# Patient Record
Sex: Female | Born: 1945 | Race: White | Hispanic: No | Marital: Married | Smoking: Never smoker
Health system: Southern US, Community
[De-identification: ages and names within clinical notes are randomized; demographics above are authoritative.]

## PROBLEM LIST (undated history)

## (undated) DIAGNOSIS — H8390 Unspecified disease of inner ear, unspecified ear: Secondary | ICD-10-CM

## (undated) DIAGNOSIS — I10 Essential (primary) hypertension: Secondary | ICD-10-CM

---

## 1998-08-19 ENCOUNTER — Inpatient Hospital Stay (HOSPITAL_COMMUNITY): Admission: RE | Admit: 1998-08-19 | Discharge: 1998-08-21 | Payer: Self-pay | Admitting: Obstetrics & Gynecology

## 1999-09-07 ENCOUNTER — Other Ambulatory Visit: Admission: RE | Admit: 1999-09-07 | Discharge: 1999-09-07 | Payer: Self-pay | Admitting: Obstetrics & Gynecology

## 2001-11-29 ENCOUNTER — Other Ambulatory Visit: Admission: RE | Admit: 2001-11-29 | Discharge: 2001-11-29 | Payer: Self-pay | Admitting: Obstetrics & Gynecology

## 2003-01-30 ENCOUNTER — Other Ambulatory Visit: Admission: RE | Admit: 2003-01-30 | Discharge: 2003-01-30 | Payer: Self-pay | Admitting: Obstetrics & Gynecology

## 2004-04-15 ENCOUNTER — Other Ambulatory Visit: Admission: RE | Admit: 2004-04-15 | Discharge: 2004-04-15 | Payer: Self-pay | Admitting: Obstetrics & Gynecology

## 2005-04-05 ENCOUNTER — Inpatient Hospital Stay (HOSPITAL_COMMUNITY): Admission: RE | Admit: 2005-04-05 | Discharge: 2005-04-09 | Payer: Self-pay | Admitting: Orthopedic Surgery

## 2005-07-26 ENCOUNTER — Encounter: Admission: RE | Admit: 2005-07-26 | Discharge: 2005-07-26 | Payer: Self-pay | Admitting: Internal Medicine

## 2005-12-15 ENCOUNTER — Other Ambulatory Visit: Admission: RE | Admit: 2005-12-15 | Discharge: 2005-12-15 | Payer: Self-pay | Admitting: Obstetrics & Gynecology

## 2006-04-04 ENCOUNTER — Inpatient Hospital Stay (HOSPITAL_COMMUNITY): Admission: RE | Admit: 2006-04-04 | Discharge: 2006-04-07 | Payer: Self-pay | Admitting: Orthopedic Surgery

## 2011-11-15 DIAGNOSIS — E785 Hyperlipidemia, unspecified: Secondary | ICD-10-CM | POA: Diagnosis not present

## 2011-11-15 DIAGNOSIS — I10 Essential (primary) hypertension: Secondary | ICD-10-CM | POA: Diagnosis not present

## 2011-11-15 DIAGNOSIS — R82998 Other abnormal findings in urine: Secondary | ICD-10-CM | POA: Diagnosis not present

## 2011-11-22 DIAGNOSIS — E785 Hyperlipidemia, unspecified: Secondary | ICD-10-CM | POA: Diagnosis not present

## 2011-11-22 DIAGNOSIS — Z0000 Encounter for general adult medical examination without abnormal findings: Secondary | ICD-10-CM | POA: Diagnosis not present

## 2011-11-22 DIAGNOSIS — I10 Essential (primary) hypertension: Secondary | ICD-10-CM | POA: Diagnosis not present

## 2011-11-23 DIAGNOSIS — Z1212 Encounter for screening for malignant neoplasm of rectum: Secondary | ICD-10-CM | POA: Diagnosis not present

## 2011-12-20 DIAGNOSIS — K648 Other hemorrhoids: Secondary | ICD-10-CM | POA: Diagnosis not present

## 2011-12-20 DIAGNOSIS — Z1211 Encounter for screening for malignant neoplasm of colon: Secondary | ICD-10-CM | POA: Diagnosis not present

## 2011-12-20 DIAGNOSIS — Z86010 Personal history of colonic polyps: Secondary | ICD-10-CM | POA: Diagnosis not present

## 2011-12-20 DIAGNOSIS — K5730 Diverticulosis of large intestine without perforation or abscess without bleeding: Secondary | ICD-10-CM | POA: Diagnosis not present

## 2012-05-31 DIAGNOSIS — H8109 Meniere's disease, unspecified ear: Secondary | ICD-10-CM | POA: Diagnosis not present

## 2012-05-31 DIAGNOSIS — M1990 Unspecified osteoarthritis, unspecified site: Secondary | ICD-10-CM | POA: Diagnosis not present

## 2012-05-31 DIAGNOSIS — I10 Essential (primary) hypertension: Secondary | ICD-10-CM | POA: Diagnosis not present

## 2012-08-08 DIAGNOSIS — Z23 Encounter for immunization: Secondary | ICD-10-CM | POA: Diagnosis not present

## 2013-01-01 DIAGNOSIS — I10 Essential (primary) hypertension: Secondary | ICD-10-CM | POA: Diagnosis not present

## 2013-01-01 DIAGNOSIS — E785 Hyperlipidemia, unspecified: Secondary | ICD-10-CM | POA: Diagnosis not present

## 2013-01-01 DIAGNOSIS — R82998 Other abnormal findings in urine: Secondary | ICD-10-CM | POA: Diagnosis not present

## 2013-01-08 DIAGNOSIS — E785 Hyperlipidemia, unspecified: Secondary | ICD-10-CM | POA: Diagnosis not present

## 2013-01-08 DIAGNOSIS — F411 Generalized anxiety disorder: Secondary | ICD-10-CM | POA: Diagnosis not present

## 2013-01-08 DIAGNOSIS — Z79899 Other long term (current) drug therapy: Secondary | ICD-10-CM | POA: Diagnosis not present

## 2013-01-08 DIAGNOSIS — I10 Essential (primary) hypertension: Secondary | ICD-10-CM | POA: Diagnosis not present

## 2013-01-08 DIAGNOSIS — M545 Low back pain: Secondary | ICD-10-CM | POA: Diagnosis not present

## 2013-01-08 DIAGNOSIS — E669 Obesity, unspecified: Secondary | ICD-10-CM | POA: Diagnosis not present

## 2013-01-08 DIAGNOSIS — Z0000 Encounter for general adult medical examination without abnormal findings: Secondary | ICD-10-CM | POA: Diagnosis not present

## 2013-01-10 DIAGNOSIS — Z1212 Encounter for screening for malignant neoplasm of rectum: Secondary | ICD-10-CM | POA: Diagnosis not present

## 2013-10-22 DIAGNOSIS — Z23 Encounter for immunization: Secondary | ICD-10-CM | POA: Diagnosis not present

## 2013-12-10 DIAGNOSIS — H3581 Retinal edema: Secondary | ICD-10-CM | POA: Diagnosis not present

## 2013-12-24 DIAGNOSIS — H3581 Retinal edema: Secondary | ICD-10-CM | POA: Diagnosis not present

## 2014-01-07 ENCOUNTER — Encounter (INDEPENDENT_AMBULATORY_CARE_PROVIDER_SITE_OTHER): Payer: Medicare Other | Admitting: Ophthalmology

## 2014-01-07 DIAGNOSIS — I10 Essential (primary) hypertension: Secondary | ICD-10-CM

## 2014-01-07 DIAGNOSIS — H35719 Central serous chorioretinopathy, unspecified eye: Secondary | ICD-10-CM | POA: Diagnosis not present

## 2014-01-07 DIAGNOSIS — H43819 Vitreous degeneration, unspecified eye: Secondary | ICD-10-CM

## 2014-01-07 DIAGNOSIS — H35039 Hypertensive retinopathy, unspecified eye: Secondary | ICD-10-CM | POA: Diagnosis not present

## 2014-01-21 DIAGNOSIS — R82998 Other abnormal findings in urine: Secondary | ICD-10-CM | POA: Diagnosis not present

## 2014-01-21 DIAGNOSIS — I10 Essential (primary) hypertension: Secondary | ICD-10-CM | POA: Diagnosis not present

## 2014-01-21 DIAGNOSIS — E785 Hyperlipidemia, unspecified: Secondary | ICD-10-CM | POA: Diagnosis not present

## 2014-01-28 DIAGNOSIS — Z1331 Encounter for screening for depression: Secondary | ICD-10-CM | POA: Diagnosis not present

## 2014-01-28 DIAGNOSIS — M545 Low back pain, unspecified: Secondary | ICD-10-CM | POA: Diagnosis not present

## 2014-01-28 DIAGNOSIS — R7309 Other abnormal glucose: Secondary | ICD-10-CM | POA: Diagnosis not present

## 2014-01-28 DIAGNOSIS — I10 Essential (primary) hypertension: Secondary | ICD-10-CM | POA: Diagnosis not present

## 2014-01-28 DIAGNOSIS — E785 Hyperlipidemia, unspecified: Secondary | ICD-10-CM | POA: Diagnosis not present

## 2014-01-28 DIAGNOSIS — F411 Generalized anxiety disorder: Secondary | ICD-10-CM | POA: Diagnosis not present

## 2014-01-28 DIAGNOSIS — M1990 Unspecified osteoarthritis, unspecified site: Secondary | ICD-10-CM | POA: Diagnosis not present

## 2014-01-28 DIAGNOSIS — E669 Obesity, unspecified: Secondary | ICD-10-CM | POA: Diagnosis not present

## 2014-01-28 DIAGNOSIS — Z0000 Encounter for general adult medical examination without abnormal findings: Secondary | ICD-10-CM | POA: Diagnosis not present

## 2014-01-30 DIAGNOSIS — Z1212 Encounter for screening for malignant neoplasm of rectum: Secondary | ICD-10-CM | POA: Diagnosis not present

## 2014-02-04 ENCOUNTER — Encounter (INDEPENDENT_AMBULATORY_CARE_PROVIDER_SITE_OTHER): Payer: Medicare Other | Admitting: Ophthalmology

## 2014-02-04 DIAGNOSIS — H2510 Age-related nuclear cataract, unspecified eye: Secondary | ICD-10-CM

## 2014-02-04 DIAGNOSIS — H35719 Central serous chorioretinopathy, unspecified eye: Secondary | ICD-10-CM

## 2014-02-04 DIAGNOSIS — I10 Essential (primary) hypertension: Secondary | ICD-10-CM | POA: Diagnosis not present

## 2014-02-04 DIAGNOSIS — H35039 Hypertensive retinopathy, unspecified eye: Secondary | ICD-10-CM

## 2014-02-04 DIAGNOSIS — H43819 Vitreous degeneration, unspecified eye: Secondary | ICD-10-CM

## 2014-03-04 ENCOUNTER — Encounter (INDEPENDENT_AMBULATORY_CARE_PROVIDER_SITE_OTHER): Payer: Medicare Other | Admitting: Ophthalmology

## 2014-03-04 DIAGNOSIS — H35039 Hypertensive retinopathy, unspecified eye: Secondary | ICD-10-CM

## 2014-03-04 DIAGNOSIS — H43819 Vitreous degeneration, unspecified eye: Secondary | ICD-10-CM

## 2014-03-04 DIAGNOSIS — I10 Essential (primary) hypertension: Secondary | ICD-10-CM | POA: Diagnosis not present

## 2014-03-04 DIAGNOSIS — H2510 Age-related nuclear cataract, unspecified eye: Secondary | ICD-10-CM

## 2014-03-04 DIAGNOSIS — H35719 Central serous chorioretinopathy, unspecified eye: Secondary | ICD-10-CM

## 2014-03-06 ENCOUNTER — Encounter (INDEPENDENT_AMBULATORY_CARE_PROVIDER_SITE_OTHER): Payer: Medicare Other | Admitting: Ophthalmology

## 2014-03-28 DIAGNOSIS — Z1231 Encounter for screening mammogram for malignant neoplasm of breast: Secondary | ICD-10-CM | POA: Diagnosis not present

## 2014-03-28 DIAGNOSIS — Z124 Encounter for screening for malignant neoplasm of cervix: Secondary | ICD-10-CM | POA: Diagnosis not present

## 2014-04-08 ENCOUNTER — Encounter (INDEPENDENT_AMBULATORY_CARE_PROVIDER_SITE_OTHER): Payer: Medicare Other | Admitting: Ophthalmology

## 2014-04-08 DIAGNOSIS — H35039 Hypertensive retinopathy, unspecified eye: Secondary | ICD-10-CM

## 2014-04-08 DIAGNOSIS — H43819 Vitreous degeneration, unspecified eye: Secondary | ICD-10-CM

## 2014-04-08 DIAGNOSIS — H35719 Central serous chorioretinopathy, unspecified eye: Secondary | ICD-10-CM | POA: Diagnosis not present

## 2014-04-08 DIAGNOSIS — I10 Essential (primary) hypertension: Secondary | ICD-10-CM | POA: Diagnosis not present

## 2014-04-08 DIAGNOSIS — H2510 Age-related nuclear cataract, unspecified eye: Secondary | ICD-10-CM

## 2014-06-10 ENCOUNTER — Encounter (INDEPENDENT_AMBULATORY_CARE_PROVIDER_SITE_OTHER): Payer: Medicare Other | Admitting: Ophthalmology

## 2014-06-10 DIAGNOSIS — H35719 Central serous chorioretinopathy, unspecified eye: Secondary | ICD-10-CM

## 2014-06-10 DIAGNOSIS — I10 Essential (primary) hypertension: Secondary | ICD-10-CM | POA: Diagnosis not present

## 2014-06-10 DIAGNOSIS — H43819 Vitreous degeneration, unspecified eye: Secondary | ICD-10-CM | POA: Diagnosis not present

## 2014-06-10 DIAGNOSIS — H35039 Hypertensive retinopathy, unspecified eye: Secondary | ICD-10-CM

## 2014-06-10 DIAGNOSIS — H2510 Age-related nuclear cataract, unspecified eye: Secondary | ICD-10-CM

## 2014-07-29 ENCOUNTER — Encounter (INDEPENDENT_AMBULATORY_CARE_PROVIDER_SITE_OTHER): Payer: Medicare Other | Admitting: Ophthalmology

## 2014-07-29 DIAGNOSIS — H43813 Vitreous degeneration, bilateral: Secondary | ICD-10-CM

## 2014-07-29 DIAGNOSIS — H35033 Hypertensive retinopathy, bilateral: Secondary | ICD-10-CM | POA: Diagnosis not present

## 2014-07-29 DIAGNOSIS — H35721 Serous detachment of retinal pigment epithelium, right eye: Secondary | ICD-10-CM | POA: Diagnosis not present

## 2014-07-29 DIAGNOSIS — I10 Essential (primary) hypertension: Secondary | ICD-10-CM

## 2014-08-19 ENCOUNTER — Encounter (INDEPENDENT_AMBULATORY_CARE_PROVIDER_SITE_OTHER): Payer: Medicare Other | Admitting: Ophthalmology

## 2014-08-19 DIAGNOSIS — H43813 Vitreous degeneration, bilateral: Secondary | ICD-10-CM | POA: Diagnosis not present

## 2014-08-19 DIAGNOSIS — H35033 Hypertensive retinopathy, bilateral: Secondary | ICD-10-CM | POA: Diagnosis not present

## 2014-08-19 DIAGNOSIS — H35712 Central serous chorioretinopathy, left eye: Secondary | ICD-10-CM

## 2014-08-19 DIAGNOSIS — I10 Essential (primary) hypertension: Secondary | ICD-10-CM

## 2014-08-19 DIAGNOSIS — H2513 Age-related nuclear cataract, bilateral: Secondary | ICD-10-CM | POA: Diagnosis not present

## 2014-09-03 DIAGNOSIS — J301 Allergic rhinitis due to pollen: Secondary | ICD-10-CM | POA: Diagnosis not present

## 2014-09-03 DIAGNOSIS — J320 Chronic maxillary sinusitis: Secondary | ICD-10-CM | POA: Diagnosis not present

## 2014-09-03 DIAGNOSIS — H6522 Chronic serous otitis media, left ear: Secondary | ICD-10-CM | POA: Diagnosis not present

## 2014-09-03 DIAGNOSIS — H8103 Meniere's disease, bilateral: Secondary | ICD-10-CM | POA: Diagnosis not present

## 2014-09-03 DIAGNOSIS — J040 Acute laryngitis: Secondary | ICD-10-CM | POA: Diagnosis not present

## 2014-09-03 DIAGNOSIS — J322 Chronic ethmoidal sinusitis: Secondary | ICD-10-CM | POA: Diagnosis not present

## 2014-09-03 DIAGNOSIS — J330 Polyp of nasal cavity: Secondary | ICD-10-CM | POA: Diagnosis not present

## 2014-09-03 DIAGNOSIS — J3081 Allergic rhinitis due to animal (cat) (dog) hair and dander: Secondary | ICD-10-CM | POA: Diagnosis not present

## 2014-09-10 DIAGNOSIS — J320 Chronic maxillary sinusitis: Secondary | ICD-10-CM | POA: Diagnosis not present

## 2014-09-10 DIAGNOSIS — J322 Chronic ethmoidal sinusitis: Secondary | ICD-10-CM | POA: Diagnosis not present

## 2014-09-10 DIAGNOSIS — H8103 Meniere's disease, bilateral: Secondary | ICD-10-CM | POA: Diagnosis not present

## 2014-09-30 ENCOUNTER — Encounter (INDEPENDENT_AMBULATORY_CARE_PROVIDER_SITE_OTHER): Payer: Medicare Other | Admitting: Ophthalmology

## 2014-09-30 DIAGNOSIS — I10 Essential (primary) hypertension: Secondary | ICD-10-CM

## 2014-09-30 DIAGNOSIS — H35033 Hypertensive retinopathy, bilateral: Secondary | ICD-10-CM | POA: Diagnosis not present

## 2014-09-30 DIAGNOSIS — H43813 Vitreous degeneration, bilateral: Secondary | ICD-10-CM

## 2014-09-30 DIAGNOSIS — H2513 Age-related nuclear cataract, bilateral: Secondary | ICD-10-CM | POA: Diagnosis not present

## 2014-09-30 DIAGNOSIS — H35722 Serous detachment of retinal pigment epithelium, left eye: Secondary | ICD-10-CM | POA: Diagnosis not present

## 2014-11-07 DIAGNOSIS — Z23 Encounter for immunization: Secondary | ICD-10-CM | POA: Diagnosis not present

## 2014-11-25 ENCOUNTER — Encounter (INDEPENDENT_AMBULATORY_CARE_PROVIDER_SITE_OTHER): Payer: Medicare Other | Admitting: Ophthalmology

## 2014-11-25 DIAGNOSIS — H2513 Age-related nuclear cataract, bilateral: Secondary | ICD-10-CM | POA: Diagnosis not present

## 2014-11-25 DIAGNOSIS — I10 Essential (primary) hypertension: Secondary | ICD-10-CM

## 2014-11-25 DIAGNOSIS — H35722 Serous detachment of retinal pigment epithelium, left eye: Secondary | ICD-10-CM | POA: Diagnosis not present

## 2014-11-25 DIAGNOSIS — H35033 Hypertensive retinopathy, bilateral: Secondary | ICD-10-CM | POA: Diagnosis not present

## 2014-11-25 DIAGNOSIS — H43813 Vitreous degeneration, bilateral: Secondary | ICD-10-CM

## 2014-12-16 ENCOUNTER — Ambulatory Visit (INDEPENDENT_AMBULATORY_CARE_PROVIDER_SITE_OTHER): Payer: Medicare Other | Admitting: Ophthalmology

## 2015-01-29 DIAGNOSIS — I10 Essential (primary) hypertension: Secondary | ICD-10-CM | POA: Diagnosis not present

## 2015-01-29 DIAGNOSIS — R8299 Other abnormal findings in urine: Secondary | ICD-10-CM | POA: Diagnosis not present

## 2015-01-29 DIAGNOSIS — E785 Hyperlipidemia, unspecified: Secondary | ICD-10-CM | POA: Diagnosis not present

## 2015-01-29 DIAGNOSIS — R7301 Impaired fasting glucose: Secondary | ICD-10-CM | POA: Diagnosis not present

## 2015-01-29 DIAGNOSIS — N390 Urinary tract infection, site not specified: Secondary | ICD-10-CM | POA: Diagnosis not present

## 2015-02-05 DIAGNOSIS — Z1389 Encounter for screening for other disorder: Secondary | ICD-10-CM | POA: Diagnosis not present

## 2015-02-05 DIAGNOSIS — J302 Other seasonal allergic rhinitis: Secondary | ICD-10-CM | POA: Diagnosis not present

## 2015-02-05 DIAGNOSIS — N390 Urinary tract infection, site not specified: Secondary | ICD-10-CM | POA: Diagnosis not present

## 2015-02-05 DIAGNOSIS — Z0000 Encounter for general adult medical examination without abnormal findings: Secondary | ICD-10-CM | POA: Diagnosis not present

## 2015-02-05 DIAGNOSIS — R7301 Impaired fasting glucose: Secondary | ICD-10-CM | POA: Diagnosis not present

## 2015-02-05 DIAGNOSIS — I10 Essential (primary) hypertension: Secondary | ICD-10-CM | POA: Diagnosis not present

## 2015-02-05 DIAGNOSIS — E669 Obesity, unspecified: Secondary | ICD-10-CM | POA: Diagnosis not present

## 2015-02-05 DIAGNOSIS — E785 Hyperlipidemia, unspecified: Secondary | ICD-10-CM | POA: Diagnosis not present

## 2015-02-05 DIAGNOSIS — Z6841 Body Mass Index (BMI) 40.0 and over, adult: Secondary | ICD-10-CM | POA: Diagnosis not present

## 2015-02-05 DIAGNOSIS — M545 Low back pain: Secondary | ICD-10-CM | POA: Diagnosis not present

## 2015-02-05 DIAGNOSIS — Z23 Encounter for immunization: Secondary | ICD-10-CM | POA: Diagnosis not present

## 2015-02-24 ENCOUNTER — Encounter (INDEPENDENT_AMBULATORY_CARE_PROVIDER_SITE_OTHER): Payer: Medicare Other | Admitting: Ophthalmology

## 2015-02-24 DIAGNOSIS — I10 Essential (primary) hypertension: Secondary | ICD-10-CM | POA: Diagnosis not present

## 2015-02-24 DIAGNOSIS — H35712 Central serous chorioretinopathy, left eye: Secondary | ICD-10-CM | POA: Diagnosis not present

## 2015-02-24 DIAGNOSIS — H2513 Age-related nuclear cataract, bilateral: Secondary | ICD-10-CM | POA: Diagnosis not present

## 2015-02-24 DIAGNOSIS — H35033 Hypertensive retinopathy, bilateral: Secondary | ICD-10-CM

## 2015-02-24 DIAGNOSIS — H43813 Vitreous degeneration, bilateral: Secondary | ICD-10-CM

## 2015-03-03 DIAGNOSIS — H35712 Central serous chorioretinopathy, left eye: Secondary | ICD-10-CM | POA: Diagnosis not present

## 2015-03-12 DIAGNOSIS — Z780 Asymptomatic menopausal state: Secondary | ICD-10-CM | POA: Diagnosis not present

## 2015-06-13 DIAGNOSIS — J040 Acute laryngitis: Secondary | ICD-10-CM | POA: Diagnosis not present

## 2015-06-13 DIAGNOSIS — H903 Sensorineural hearing loss, bilateral: Secondary | ICD-10-CM | POA: Diagnosis not present

## 2015-06-13 DIAGNOSIS — H8103 Meniere's disease, bilateral: Secondary | ICD-10-CM | POA: Diagnosis not present

## 2015-06-13 DIAGNOSIS — J410 Simple chronic bronchitis: Secondary | ICD-10-CM | POA: Diagnosis not present

## 2015-06-13 DIAGNOSIS — J320 Chronic maxillary sinusitis: Secondary | ICD-10-CM | POA: Diagnosis not present

## 2015-06-13 DIAGNOSIS — J322 Chronic ethmoidal sinusitis: Secondary | ICD-10-CM | POA: Diagnosis not present

## 2015-06-26 DIAGNOSIS — J302 Other seasonal allergic rhinitis: Secondary | ICD-10-CM | POA: Diagnosis not present

## 2015-06-26 DIAGNOSIS — J301 Allergic rhinitis due to pollen: Secondary | ICD-10-CM | POA: Diagnosis not present

## 2015-07-24 DIAGNOSIS — J320 Chronic maxillary sinusitis: Secondary | ICD-10-CM | POA: Diagnosis not present

## 2015-07-24 DIAGNOSIS — H9313 Tinnitus, bilateral: Secondary | ICD-10-CM | POA: Diagnosis not present

## 2015-07-24 DIAGNOSIS — H8103 Meniere's disease, bilateral: Secondary | ICD-10-CM | POA: Diagnosis not present

## 2015-07-24 DIAGNOSIS — J322 Chronic ethmoidal sinusitis: Secondary | ICD-10-CM | POA: Diagnosis not present

## 2015-07-24 DIAGNOSIS — H8143 Vertigo of central origin, bilateral: Secondary | ICD-10-CM | POA: Diagnosis not present

## 2015-07-28 ENCOUNTER — Encounter (INDEPENDENT_AMBULATORY_CARE_PROVIDER_SITE_OTHER): Payer: Medicare Other | Admitting: Ophthalmology

## 2015-07-28 DIAGNOSIS — H35033 Hypertensive retinopathy, bilateral: Secondary | ICD-10-CM

## 2015-07-28 DIAGNOSIS — H2513 Age-related nuclear cataract, bilateral: Secondary | ICD-10-CM | POA: Diagnosis not present

## 2015-07-28 DIAGNOSIS — I10 Essential (primary) hypertension: Secondary | ICD-10-CM | POA: Diagnosis not present

## 2015-07-28 DIAGNOSIS — H43813 Vitreous degeneration, bilateral: Secondary | ICD-10-CM

## 2015-07-28 DIAGNOSIS — H35712 Central serous chorioretinopathy, left eye: Secondary | ICD-10-CM

## 2015-10-22 DIAGNOSIS — Z23 Encounter for immunization: Secondary | ICD-10-CM | POA: Diagnosis not present

## 2015-12-29 ENCOUNTER — Encounter (INDEPENDENT_AMBULATORY_CARE_PROVIDER_SITE_OTHER): Payer: Medicare Other | Admitting: Ophthalmology

## 2015-12-29 DIAGNOSIS — H43813 Vitreous degeneration, bilateral: Secondary | ICD-10-CM

## 2015-12-29 DIAGNOSIS — H35033 Hypertensive retinopathy, bilateral: Secondary | ICD-10-CM | POA: Diagnosis not present

## 2015-12-29 DIAGNOSIS — H35712 Central serous chorioretinopathy, left eye: Secondary | ICD-10-CM

## 2015-12-29 DIAGNOSIS — I10 Essential (primary) hypertension: Secondary | ICD-10-CM

## 2015-12-29 DIAGNOSIS — H2513 Age-related nuclear cataract, bilateral: Secondary | ICD-10-CM | POA: Diagnosis not present

## 2016-01-27 DIAGNOSIS — H8143 Vertigo of central origin, bilateral: Secondary | ICD-10-CM | POA: Diagnosis not present

## 2016-01-27 DIAGNOSIS — H9313 Tinnitus, bilateral: Secondary | ICD-10-CM | POA: Diagnosis not present

## 2016-01-27 DIAGNOSIS — J320 Chronic maxillary sinusitis: Secondary | ICD-10-CM | POA: Diagnosis not present

## 2016-01-27 DIAGNOSIS — H8103 Meniere's disease, bilateral: Secondary | ICD-10-CM | POA: Diagnosis not present

## 2016-01-27 DIAGNOSIS — J322 Chronic ethmoidal sinusitis: Secondary | ICD-10-CM | POA: Diagnosis not present

## 2016-01-27 DIAGNOSIS — B370 Candidal stomatitis: Secondary | ICD-10-CM | POA: Diagnosis not present

## 2016-02-03 DIAGNOSIS — Z96651 Presence of right artificial knee joint: Secondary | ICD-10-CM | POA: Diagnosis not present

## 2016-02-03 DIAGNOSIS — Z471 Aftercare following joint replacement surgery: Secondary | ICD-10-CM | POA: Diagnosis not present

## 2016-02-03 DIAGNOSIS — Z96653 Presence of artificial knee joint, bilateral: Secondary | ICD-10-CM | POA: Diagnosis not present

## 2016-02-03 DIAGNOSIS — Z96652 Presence of left artificial knee joint: Secondary | ICD-10-CM | POA: Diagnosis not present

## 2016-02-04 DIAGNOSIS — R7301 Impaired fasting glucose: Secondary | ICD-10-CM | POA: Diagnosis not present

## 2016-02-04 DIAGNOSIS — E784 Other hyperlipidemia: Secondary | ICD-10-CM | POA: Diagnosis not present

## 2016-02-04 DIAGNOSIS — I10 Essential (primary) hypertension: Secondary | ICD-10-CM | POA: Diagnosis not present

## 2016-02-04 DIAGNOSIS — R8299 Other abnormal findings in urine: Secondary | ICD-10-CM | POA: Diagnosis not present

## 2016-02-04 DIAGNOSIS — N390 Urinary tract infection, site not specified: Secondary | ICD-10-CM | POA: Diagnosis not present

## 2016-02-10 DIAGNOSIS — R7301 Impaired fasting glucose: Secondary | ICD-10-CM | POA: Diagnosis not present

## 2016-02-10 DIAGNOSIS — M545 Low back pain: Secondary | ICD-10-CM | POA: Diagnosis not present

## 2016-02-10 DIAGNOSIS — E784 Other hyperlipidemia: Secondary | ICD-10-CM | POA: Diagnosis not present

## 2016-02-10 DIAGNOSIS — H8103 Meniere's disease, bilateral: Secondary | ICD-10-CM | POA: Diagnosis not present

## 2016-02-10 DIAGNOSIS — E668 Other obesity: Secondary | ICD-10-CM | POA: Diagnosis not present

## 2016-02-10 DIAGNOSIS — Z6839 Body mass index (BMI) 39.0-39.9, adult: Secondary | ICD-10-CM | POA: Diagnosis not present

## 2016-02-10 DIAGNOSIS — I10 Essential (primary) hypertension: Secondary | ICD-10-CM | POA: Diagnosis not present

## 2016-02-10 DIAGNOSIS — M1990 Unspecified osteoarthritis, unspecified site: Secondary | ICD-10-CM | POA: Diagnosis not present

## 2016-02-10 DIAGNOSIS — Z0000 Encounter for general adult medical examination without abnormal findings: Secondary | ICD-10-CM | POA: Diagnosis not present

## 2016-02-10 DIAGNOSIS — Z1389 Encounter for screening for other disorder: Secondary | ICD-10-CM | POA: Diagnosis not present

## 2016-02-10 DIAGNOSIS — N390 Urinary tract infection, site not specified: Secondary | ICD-10-CM | POA: Diagnosis not present

## 2016-02-11 DIAGNOSIS — Z1212 Encounter for screening for malignant neoplasm of rectum: Secondary | ICD-10-CM | POA: Diagnosis not present

## 2016-05-31 ENCOUNTER — Encounter (INDEPENDENT_AMBULATORY_CARE_PROVIDER_SITE_OTHER): Payer: Medicare Other | Admitting: Ophthalmology

## 2016-05-31 DIAGNOSIS — H2513 Age-related nuclear cataract, bilateral: Secondary | ICD-10-CM

## 2016-05-31 DIAGNOSIS — H353111 Nonexudative age-related macular degeneration, right eye, early dry stage: Secondary | ICD-10-CM | POA: Diagnosis not present

## 2016-05-31 DIAGNOSIS — H35033 Hypertensive retinopathy, bilateral: Secondary | ICD-10-CM | POA: Diagnosis not present

## 2016-05-31 DIAGNOSIS — H43813 Vitreous degeneration, bilateral: Secondary | ICD-10-CM

## 2016-05-31 DIAGNOSIS — I10 Essential (primary) hypertension: Secondary | ICD-10-CM | POA: Diagnosis not present

## 2016-05-31 DIAGNOSIS — H35372 Puckering of macula, left eye: Secondary | ICD-10-CM | POA: Diagnosis not present

## 2016-10-27 DIAGNOSIS — Z23 Encounter for immunization: Secondary | ICD-10-CM | POA: Diagnosis not present

## 2016-11-01 ENCOUNTER — Encounter (INDEPENDENT_AMBULATORY_CARE_PROVIDER_SITE_OTHER): Payer: Medicare Other | Admitting: Ophthalmology

## 2016-11-01 DIAGNOSIS — H43813 Vitreous degeneration, bilateral: Secondary | ICD-10-CM

## 2016-11-01 DIAGNOSIS — H35371 Puckering of macula, right eye: Secondary | ICD-10-CM | POA: Diagnosis not present

## 2016-11-01 DIAGNOSIS — I10 Essential (primary) hypertension: Secondary | ICD-10-CM | POA: Diagnosis not present

## 2016-11-01 DIAGNOSIS — H2513 Age-related nuclear cataract, bilateral: Secondary | ICD-10-CM | POA: Diagnosis not present

## 2016-11-01 DIAGNOSIS — H35033 Hypertensive retinopathy, bilateral: Secondary | ICD-10-CM | POA: Diagnosis not present

## 2017-02-08 DIAGNOSIS — N390 Urinary tract infection, site not specified: Secondary | ICD-10-CM | POA: Diagnosis not present

## 2017-02-08 DIAGNOSIS — R7301 Impaired fasting glucose: Secondary | ICD-10-CM | POA: Diagnosis not present

## 2017-02-08 DIAGNOSIS — I10 Essential (primary) hypertension: Secondary | ICD-10-CM | POA: Diagnosis not present

## 2017-02-08 DIAGNOSIS — R8299 Other abnormal findings in urine: Secondary | ICD-10-CM | POA: Diagnosis not present

## 2017-02-08 DIAGNOSIS — E784 Other hyperlipidemia: Secondary | ICD-10-CM | POA: Diagnosis not present

## 2017-02-15 DIAGNOSIS — Z0000 Encounter for general adult medical examination without abnormal findings: Secondary | ICD-10-CM | POA: Diagnosis not present

## 2017-02-15 DIAGNOSIS — H8103 Meniere's disease, bilateral: Secondary | ICD-10-CM | POA: Diagnosis not present

## 2017-02-15 DIAGNOSIS — I10 Essential (primary) hypertension: Secondary | ICD-10-CM | POA: Diagnosis not present

## 2017-02-15 DIAGNOSIS — E784 Other hyperlipidemia: Secondary | ICD-10-CM | POA: Diagnosis not present

## 2017-02-15 DIAGNOSIS — Z6838 Body mass index (BMI) 38.0-38.9, adult: Secondary | ICD-10-CM | POA: Diagnosis not present

## 2017-02-15 DIAGNOSIS — M1990 Unspecified osteoarthritis, unspecified site: Secondary | ICD-10-CM | POA: Diagnosis not present

## 2017-02-15 DIAGNOSIS — R7301 Impaired fasting glucose: Secondary | ICD-10-CM | POA: Diagnosis not present

## 2017-02-15 DIAGNOSIS — E668 Other obesity: Secondary | ICD-10-CM | POA: Diagnosis not present

## 2017-02-15 DIAGNOSIS — Z1389 Encounter for screening for other disorder: Secondary | ICD-10-CM | POA: Diagnosis not present

## 2017-02-21 DIAGNOSIS — Z1212 Encounter for screening for malignant neoplasm of rectum: Secondary | ICD-10-CM | POA: Diagnosis not present

## 2017-05-02 ENCOUNTER — Ambulatory Visit (INDEPENDENT_AMBULATORY_CARE_PROVIDER_SITE_OTHER): Payer: Medicare Other | Admitting: Ophthalmology

## 2017-05-02 DIAGNOSIS — H353111 Nonexudative age-related macular degeneration, right eye, early dry stage: Secondary | ICD-10-CM

## 2017-05-02 DIAGNOSIS — H35033 Hypertensive retinopathy, bilateral: Secondary | ICD-10-CM | POA: Diagnosis not present

## 2017-05-02 DIAGNOSIS — H43813 Vitreous degeneration, bilateral: Secondary | ICD-10-CM | POA: Diagnosis not present

## 2017-05-02 DIAGNOSIS — H35712 Central serous chorioretinopathy, left eye: Secondary | ICD-10-CM | POA: Diagnosis not present

## 2017-05-02 DIAGNOSIS — I10 Essential (primary) hypertension: Secondary | ICD-10-CM | POA: Diagnosis not present

## 2017-05-02 DIAGNOSIS — H2513 Age-related nuclear cataract, bilateral: Secondary | ICD-10-CM

## 2017-05-16 DIAGNOSIS — H31012 Macula scars of posterior pole (postinflammatory) (post-traumatic), left eye: Secondary | ICD-10-CM | POA: Diagnosis not present

## 2017-05-17 DIAGNOSIS — Z124 Encounter for screening for malignant neoplasm of cervix: Secondary | ICD-10-CM | POA: Diagnosis not present

## 2017-05-17 DIAGNOSIS — Z6838 Body mass index (BMI) 38.0-38.9, adult: Secondary | ICD-10-CM | POA: Diagnosis not present

## 2017-05-17 DIAGNOSIS — Z1231 Encounter for screening mammogram for malignant neoplasm of breast: Secondary | ICD-10-CM | POA: Diagnosis not present

## 2017-06-15 ENCOUNTER — Encounter (INDEPENDENT_AMBULATORY_CARE_PROVIDER_SITE_OTHER): Payer: Medicare Other | Admitting: Ophthalmology

## 2017-06-15 DIAGNOSIS — I10 Essential (primary) hypertension: Secondary | ICD-10-CM | POA: Diagnosis not present

## 2017-06-15 DIAGNOSIS — H43813 Vitreous degeneration, bilateral: Secondary | ICD-10-CM | POA: Diagnosis not present

## 2017-06-15 DIAGNOSIS — H35712 Central serous chorioretinopathy, left eye: Secondary | ICD-10-CM

## 2017-06-15 DIAGNOSIS — H2513 Age-related nuclear cataract, bilateral: Secondary | ICD-10-CM

## 2017-06-15 DIAGNOSIS — H353111 Nonexudative age-related macular degeneration, right eye, early dry stage: Secondary | ICD-10-CM

## 2017-06-15 DIAGNOSIS — H35033 Hypertensive retinopathy, bilateral: Secondary | ICD-10-CM | POA: Diagnosis not present

## 2017-06-15 DIAGNOSIS — H353221 Exudative age-related macular degeneration, left eye, with active choroidal neovascularization: Secondary | ICD-10-CM | POA: Diagnosis not present

## 2017-06-22 DIAGNOSIS — Z23 Encounter for immunization: Secondary | ICD-10-CM | POA: Diagnosis not present

## 2017-07-18 ENCOUNTER — Encounter (INDEPENDENT_AMBULATORY_CARE_PROVIDER_SITE_OTHER): Payer: Medicare Other | Admitting: Ophthalmology

## 2017-07-18 DIAGNOSIS — H43813 Vitreous degeneration, bilateral: Secondary | ICD-10-CM | POA: Diagnosis not present

## 2017-07-18 DIAGNOSIS — H35033 Hypertensive retinopathy, bilateral: Secondary | ICD-10-CM | POA: Diagnosis not present

## 2017-07-18 DIAGNOSIS — H353221 Exudative age-related macular degeneration, left eye, with active choroidal neovascularization: Secondary | ICD-10-CM | POA: Diagnosis not present

## 2017-07-18 DIAGNOSIS — I10 Essential (primary) hypertension: Secondary | ICD-10-CM

## 2017-08-15 ENCOUNTER — Encounter (INDEPENDENT_AMBULATORY_CARE_PROVIDER_SITE_OTHER): Payer: Medicare Other | Admitting: Ophthalmology

## 2017-08-15 DIAGNOSIS — H43813 Vitreous degeneration, bilateral: Secondary | ICD-10-CM | POA: Diagnosis not present

## 2017-08-15 DIAGNOSIS — H2513 Age-related nuclear cataract, bilateral: Secondary | ICD-10-CM

## 2017-08-15 DIAGNOSIS — H35033 Hypertensive retinopathy, bilateral: Secondary | ICD-10-CM

## 2017-08-15 DIAGNOSIS — H353221 Exudative age-related macular degeneration, left eye, with active choroidal neovascularization: Secondary | ICD-10-CM

## 2017-08-15 DIAGNOSIS — I10 Essential (primary) hypertension: Secondary | ICD-10-CM

## 2017-09-02 DIAGNOSIS — R7301 Impaired fasting glucose: Secondary | ICD-10-CM | POA: Diagnosis not present

## 2017-09-02 DIAGNOSIS — Z6837 Body mass index (BMI) 37.0-37.9, adult: Secondary | ICD-10-CM | POA: Diagnosis not present

## 2017-09-02 DIAGNOSIS — I10 Essential (primary) hypertension: Secondary | ICD-10-CM | POA: Diagnosis not present

## 2017-09-02 DIAGNOSIS — H8109 Meniere's disease, unspecified ear: Secondary | ICD-10-CM | POA: Diagnosis not present

## 2017-09-02 DIAGNOSIS — H8110 Benign paroxysmal vertigo, unspecified ear: Secondary | ICD-10-CM | POA: Diagnosis not present

## 2017-09-12 ENCOUNTER — Encounter (INDEPENDENT_AMBULATORY_CARE_PROVIDER_SITE_OTHER): Payer: Medicare Other | Admitting: Ophthalmology

## 2017-09-12 DIAGNOSIS — H43813 Vitreous degeneration, bilateral: Secondary | ICD-10-CM

## 2017-09-12 DIAGNOSIS — I10 Essential (primary) hypertension: Secondary | ICD-10-CM | POA: Diagnosis not present

## 2017-09-12 DIAGNOSIS — H2513 Age-related nuclear cataract, bilateral: Secondary | ICD-10-CM

## 2017-09-12 DIAGNOSIS — H35033 Hypertensive retinopathy, bilateral: Secondary | ICD-10-CM

## 2017-09-12 DIAGNOSIS — H353222 Exudative age-related macular degeneration, left eye, with inactive choroidal neovascularization: Secondary | ICD-10-CM | POA: Diagnosis not present

## 2017-09-12 DIAGNOSIS — H353111 Nonexudative age-related macular degeneration, right eye, early dry stage: Secondary | ICD-10-CM

## 2017-10-07 ENCOUNTER — Encounter (INDEPENDENT_AMBULATORY_CARE_PROVIDER_SITE_OTHER): Payer: Medicare Other | Admitting: Ophthalmology

## 2017-10-07 DIAGNOSIS — I10 Essential (primary) hypertension: Secondary | ICD-10-CM

## 2017-10-07 DIAGNOSIS — H2513 Age-related nuclear cataract, bilateral: Secondary | ICD-10-CM | POA: Diagnosis not present

## 2017-10-07 DIAGNOSIS — H353111 Nonexudative age-related macular degeneration, right eye, early dry stage: Secondary | ICD-10-CM | POA: Diagnosis not present

## 2017-10-07 DIAGNOSIS — H353221 Exudative age-related macular degeneration, left eye, with active choroidal neovascularization: Secondary | ICD-10-CM | POA: Diagnosis not present

## 2017-10-07 DIAGNOSIS — H35033 Hypertensive retinopathy, bilateral: Secondary | ICD-10-CM | POA: Diagnosis not present

## 2017-10-07 DIAGNOSIS — H43813 Vitreous degeneration, bilateral: Secondary | ICD-10-CM | POA: Diagnosis not present

## 2017-11-07 ENCOUNTER — Encounter (INDEPENDENT_AMBULATORY_CARE_PROVIDER_SITE_OTHER): Payer: Medicare Other | Admitting: Ophthalmology

## 2017-11-10 ENCOUNTER — Encounter (INDEPENDENT_AMBULATORY_CARE_PROVIDER_SITE_OTHER): Payer: Medicare Other | Admitting: Ophthalmology

## 2017-11-10 DIAGNOSIS — H35033 Hypertensive retinopathy, bilateral: Secondary | ICD-10-CM

## 2017-11-10 DIAGNOSIS — H2513 Age-related nuclear cataract, bilateral: Secondary | ICD-10-CM

## 2017-11-10 DIAGNOSIS — H353111 Nonexudative age-related macular degeneration, right eye, early dry stage: Secondary | ICD-10-CM | POA: Diagnosis not present

## 2017-11-10 DIAGNOSIS — H43813 Vitreous degeneration, bilateral: Secondary | ICD-10-CM

## 2017-11-10 DIAGNOSIS — I10 Essential (primary) hypertension: Secondary | ICD-10-CM

## 2017-11-10 DIAGNOSIS — H353221 Exudative age-related macular degeneration, left eye, with active choroidal neovascularization: Secondary | ICD-10-CM | POA: Diagnosis not present

## 2017-12-12 ENCOUNTER — Encounter (INDEPENDENT_AMBULATORY_CARE_PROVIDER_SITE_OTHER): Payer: Medicare Other | Admitting: Ophthalmology

## 2017-12-12 DIAGNOSIS — H353111 Nonexudative age-related macular degeneration, right eye, early dry stage: Secondary | ICD-10-CM | POA: Diagnosis not present

## 2017-12-12 DIAGNOSIS — H353221 Exudative age-related macular degeneration, left eye, with active choroidal neovascularization: Secondary | ICD-10-CM | POA: Diagnosis not present

## 2017-12-12 DIAGNOSIS — H43813 Vitreous degeneration, bilateral: Secondary | ICD-10-CM | POA: Diagnosis not present

## 2017-12-12 DIAGNOSIS — H35033 Hypertensive retinopathy, bilateral: Secondary | ICD-10-CM

## 2017-12-12 DIAGNOSIS — H2513 Age-related nuclear cataract, bilateral: Secondary | ICD-10-CM | POA: Diagnosis not present

## 2017-12-12 DIAGNOSIS — I10 Essential (primary) hypertension: Secondary | ICD-10-CM | POA: Diagnosis not present

## 2018-01-23 ENCOUNTER — Encounter (INDEPENDENT_AMBULATORY_CARE_PROVIDER_SITE_OTHER): Payer: Medicare Other | Admitting: Ophthalmology

## 2018-01-23 DIAGNOSIS — H35033 Hypertensive retinopathy, bilateral: Secondary | ICD-10-CM | POA: Diagnosis not present

## 2018-01-23 DIAGNOSIS — H353111 Nonexudative age-related macular degeneration, right eye, early dry stage: Secondary | ICD-10-CM

## 2018-01-23 DIAGNOSIS — H43813 Vitreous degeneration, bilateral: Secondary | ICD-10-CM

## 2018-01-23 DIAGNOSIS — I10 Essential (primary) hypertension: Secondary | ICD-10-CM

## 2018-01-23 DIAGNOSIS — H2513 Age-related nuclear cataract, bilateral: Secondary | ICD-10-CM | POA: Diagnosis not present

## 2018-01-23 DIAGNOSIS — H353221 Exudative age-related macular degeneration, left eye, with active choroidal neovascularization: Secondary | ICD-10-CM

## 2018-02-14 DIAGNOSIS — R82998 Other abnormal findings in urine: Secondary | ICD-10-CM | POA: Diagnosis not present

## 2018-02-14 DIAGNOSIS — I10 Essential (primary) hypertension: Secondary | ICD-10-CM | POA: Diagnosis not present

## 2018-02-14 DIAGNOSIS — E7849 Other hyperlipidemia: Secondary | ICD-10-CM | POA: Diagnosis not present

## 2018-02-14 DIAGNOSIS — R7301 Impaired fasting glucose: Secondary | ICD-10-CM | POA: Diagnosis not present

## 2018-02-21 DIAGNOSIS — N390 Urinary tract infection, site not specified: Secondary | ICD-10-CM | POA: Diagnosis not present

## 2018-02-21 DIAGNOSIS — K219 Gastro-esophageal reflux disease without esophagitis: Secondary | ICD-10-CM | POA: Diagnosis not present

## 2018-02-21 DIAGNOSIS — E7849 Other hyperlipidemia: Secondary | ICD-10-CM | POA: Diagnosis not present

## 2018-02-21 DIAGNOSIS — R7301 Impaired fasting glucose: Secondary | ICD-10-CM | POA: Diagnosis not present

## 2018-02-21 DIAGNOSIS — H8103 Meniere's disease, bilateral: Secondary | ICD-10-CM | POA: Diagnosis not present

## 2018-02-21 DIAGNOSIS — I10 Essential (primary) hypertension: Secondary | ICD-10-CM | POA: Diagnosis not present

## 2018-02-21 DIAGNOSIS — Z0000 Encounter for general adult medical examination without abnormal findings: Secondary | ICD-10-CM | POA: Diagnosis not present

## 2018-02-21 DIAGNOSIS — E668 Other obesity: Secondary | ICD-10-CM | POA: Diagnosis not present

## 2018-02-21 DIAGNOSIS — M1990 Unspecified osteoarthritis, unspecified site: Secondary | ICD-10-CM | POA: Diagnosis not present

## 2018-02-21 DIAGNOSIS — F418 Other specified anxiety disorders: Secondary | ICD-10-CM | POA: Diagnosis not present

## 2018-02-21 DIAGNOSIS — Z1389 Encounter for screening for other disorder: Secondary | ICD-10-CM | POA: Diagnosis not present

## 2018-02-21 DIAGNOSIS — Z6839 Body mass index (BMI) 39.0-39.9, adult: Secondary | ICD-10-CM | POA: Diagnosis not present

## 2018-02-27 ENCOUNTER — Encounter (INDEPENDENT_AMBULATORY_CARE_PROVIDER_SITE_OTHER): Payer: Medicare Other | Admitting: Ophthalmology

## 2018-02-27 DIAGNOSIS — H353221 Exudative age-related macular degeneration, left eye, with active choroidal neovascularization: Secondary | ICD-10-CM

## 2018-02-27 DIAGNOSIS — H35033 Hypertensive retinopathy, bilateral: Secondary | ICD-10-CM | POA: Diagnosis not present

## 2018-02-27 DIAGNOSIS — I10 Essential (primary) hypertension: Secondary | ICD-10-CM | POA: Diagnosis not present

## 2018-02-27 DIAGNOSIS — H353111 Nonexudative age-related macular degeneration, right eye, early dry stage: Secondary | ICD-10-CM | POA: Diagnosis not present

## 2018-02-27 DIAGNOSIS — H43813 Vitreous degeneration, bilateral: Secondary | ICD-10-CM | POA: Diagnosis not present

## 2018-03-02 DIAGNOSIS — Z1212 Encounter for screening for malignant neoplasm of rectum: Secondary | ICD-10-CM | POA: Diagnosis not present

## 2018-04-03 ENCOUNTER — Encounter (INDEPENDENT_AMBULATORY_CARE_PROVIDER_SITE_OTHER): Payer: Medicare Other | Admitting: Ophthalmology

## 2018-04-03 DIAGNOSIS — H353111 Nonexudative age-related macular degeneration, right eye, early dry stage: Secondary | ICD-10-CM

## 2018-04-03 DIAGNOSIS — H43813 Vitreous degeneration, bilateral: Secondary | ICD-10-CM | POA: Diagnosis not present

## 2018-04-03 DIAGNOSIS — I10 Essential (primary) hypertension: Secondary | ICD-10-CM | POA: Diagnosis not present

## 2018-04-03 DIAGNOSIS — H353221 Exudative age-related macular degeneration, left eye, with active choroidal neovascularization: Secondary | ICD-10-CM | POA: Diagnosis not present

## 2018-04-03 DIAGNOSIS — H35033 Hypertensive retinopathy, bilateral: Secondary | ICD-10-CM

## 2018-05-05 ENCOUNTER — Encounter (INDEPENDENT_AMBULATORY_CARE_PROVIDER_SITE_OTHER): Payer: Medicare Other | Admitting: Ophthalmology

## 2018-05-05 DIAGNOSIS — I10 Essential (primary) hypertension: Secondary | ICD-10-CM

## 2018-05-05 DIAGNOSIS — H35033 Hypertensive retinopathy, bilateral: Secondary | ICD-10-CM

## 2018-05-05 DIAGNOSIS — H353111 Nonexudative age-related macular degeneration, right eye, early dry stage: Secondary | ICD-10-CM | POA: Diagnosis not present

## 2018-05-05 DIAGNOSIS — H43813 Vitreous degeneration, bilateral: Secondary | ICD-10-CM | POA: Diagnosis not present

## 2018-05-05 DIAGNOSIS — H353221 Exudative age-related macular degeneration, left eye, with active choroidal neovascularization: Secondary | ICD-10-CM | POA: Diagnosis not present

## 2018-05-05 DIAGNOSIS — H2513 Age-related nuclear cataract, bilateral: Secondary | ICD-10-CM | POA: Diagnosis not present

## 2018-06-05 ENCOUNTER — Encounter (INDEPENDENT_AMBULATORY_CARE_PROVIDER_SITE_OTHER): Payer: Medicare Other | Admitting: Ophthalmology

## 2018-06-05 DIAGNOSIS — H353111 Nonexudative age-related macular degeneration, right eye, early dry stage: Secondary | ICD-10-CM | POA: Diagnosis not present

## 2018-06-05 DIAGNOSIS — I10 Essential (primary) hypertension: Secondary | ICD-10-CM

## 2018-06-05 DIAGNOSIS — H2513 Age-related nuclear cataract, bilateral: Secondary | ICD-10-CM

## 2018-06-05 DIAGNOSIS — H43813 Vitreous degeneration, bilateral: Secondary | ICD-10-CM | POA: Diagnosis not present

## 2018-06-05 DIAGNOSIS — H35033 Hypertensive retinopathy, bilateral: Secondary | ICD-10-CM | POA: Diagnosis not present

## 2018-06-05 DIAGNOSIS — H353221 Exudative age-related macular degeneration, left eye, with active choroidal neovascularization: Secondary | ICD-10-CM | POA: Diagnosis not present

## 2018-07-03 ENCOUNTER — Encounter (INDEPENDENT_AMBULATORY_CARE_PROVIDER_SITE_OTHER): Payer: Medicare Other | Admitting: Ophthalmology

## 2018-07-03 DIAGNOSIS — H35033 Hypertensive retinopathy, bilateral: Secondary | ICD-10-CM

## 2018-07-03 DIAGNOSIS — H353221 Exudative age-related macular degeneration, left eye, with active choroidal neovascularization: Secondary | ICD-10-CM | POA: Diagnosis not present

## 2018-07-03 DIAGNOSIS — H353111 Nonexudative age-related macular degeneration, right eye, early dry stage: Secondary | ICD-10-CM | POA: Diagnosis not present

## 2018-07-03 DIAGNOSIS — I10 Essential (primary) hypertension: Secondary | ICD-10-CM

## 2018-07-03 DIAGNOSIS — H43813 Vitreous degeneration, bilateral: Secondary | ICD-10-CM | POA: Diagnosis not present

## 2018-08-07 ENCOUNTER — Encounter (INDEPENDENT_AMBULATORY_CARE_PROVIDER_SITE_OTHER): Payer: Medicare Other | Admitting: Ophthalmology

## 2018-08-07 DIAGNOSIS — H353221 Exudative age-related macular degeneration, left eye, with active choroidal neovascularization: Secondary | ICD-10-CM | POA: Diagnosis not present

## 2018-08-07 DIAGNOSIS — H353111 Nonexudative age-related macular degeneration, right eye, early dry stage: Secondary | ICD-10-CM

## 2018-08-07 DIAGNOSIS — H2513 Age-related nuclear cataract, bilateral: Secondary | ICD-10-CM | POA: Diagnosis not present

## 2018-08-07 DIAGNOSIS — H35033 Hypertensive retinopathy, bilateral: Secondary | ICD-10-CM

## 2018-08-07 DIAGNOSIS — H43813 Vitreous degeneration, bilateral: Secondary | ICD-10-CM | POA: Diagnosis not present

## 2018-08-07 DIAGNOSIS — I10 Essential (primary) hypertension: Secondary | ICD-10-CM

## 2018-08-26 DIAGNOSIS — Z23 Encounter for immunization: Secondary | ICD-10-CM | POA: Diagnosis not present

## 2018-09-11 ENCOUNTER — Encounter (INDEPENDENT_AMBULATORY_CARE_PROVIDER_SITE_OTHER): Payer: Medicare Other | Admitting: Ophthalmology

## 2018-09-11 DIAGNOSIS — I10 Essential (primary) hypertension: Secondary | ICD-10-CM | POA: Diagnosis not present

## 2018-09-11 DIAGNOSIS — H35033 Hypertensive retinopathy, bilateral: Secondary | ICD-10-CM | POA: Diagnosis not present

## 2018-09-11 DIAGNOSIS — H2513 Age-related nuclear cataract, bilateral: Secondary | ICD-10-CM | POA: Diagnosis not present

## 2018-09-11 DIAGNOSIS — H353221 Exudative age-related macular degeneration, left eye, with active choroidal neovascularization: Secondary | ICD-10-CM

## 2018-09-11 DIAGNOSIS — H43813 Vitreous degeneration, bilateral: Secondary | ICD-10-CM

## 2018-09-11 DIAGNOSIS — H353111 Nonexudative age-related macular degeneration, right eye, early dry stage: Secondary | ICD-10-CM

## 2018-10-09 ENCOUNTER — Encounter (INDEPENDENT_AMBULATORY_CARE_PROVIDER_SITE_OTHER): Payer: Medicare Other | Admitting: Ophthalmology

## 2018-10-09 DIAGNOSIS — H35033 Hypertensive retinopathy, bilateral: Secondary | ICD-10-CM

## 2018-10-09 DIAGNOSIS — H353221 Exudative age-related macular degeneration, left eye, with active choroidal neovascularization: Secondary | ICD-10-CM

## 2018-10-09 DIAGNOSIS — H353111 Nonexudative age-related macular degeneration, right eye, early dry stage: Secondary | ICD-10-CM

## 2018-10-09 DIAGNOSIS — H43813 Vitreous degeneration, bilateral: Secondary | ICD-10-CM | POA: Diagnosis not present

## 2018-10-09 DIAGNOSIS — I10 Essential (primary) hypertension: Secondary | ICD-10-CM

## 2018-11-13 ENCOUNTER — Encounter (INDEPENDENT_AMBULATORY_CARE_PROVIDER_SITE_OTHER): Payer: Medicare Other | Admitting: Ophthalmology

## 2018-11-13 DIAGNOSIS — H353112 Nonexudative age-related macular degeneration, right eye, intermediate dry stage: Secondary | ICD-10-CM | POA: Diagnosis not present

## 2018-11-13 DIAGNOSIS — H353221 Exudative age-related macular degeneration, left eye, with active choroidal neovascularization: Secondary | ICD-10-CM

## 2018-11-13 DIAGNOSIS — H35033 Hypertensive retinopathy, bilateral: Secondary | ICD-10-CM | POA: Diagnosis not present

## 2018-11-13 DIAGNOSIS — I10 Essential (primary) hypertension: Secondary | ICD-10-CM | POA: Diagnosis not present

## 2018-11-13 DIAGNOSIS — H43813 Vitreous degeneration, bilateral: Secondary | ICD-10-CM

## 2018-12-18 ENCOUNTER — Encounter (INDEPENDENT_AMBULATORY_CARE_PROVIDER_SITE_OTHER): Payer: Medicare Other | Admitting: Ophthalmology

## 2018-12-18 DIAGNOSIS — H353111 Nonexudative age-related macular degeneration, right eye, early dry stage: Secondary | ICD-10-CM

## 2018-12-18 DIAGNOSIS — I10 Essential (primary) hypertension: Secondary | ICD-10-CM | POA: Diagnosis not present

## 2018-12-18 DIAGNOSIS — H353221 Exudative age-related macular degeneration, left eye, with active choroidal neovascularization: Secondary | ICD-10-CM | POA: Diagnosis not present

## 2018-12-18 DIAGNOSIS — H2513 Age-related nuclear cataract, bilateral: Secondary | ICD-10-CM

## 2018-12-18 DIAGNOSIS — H35033 Hypertensive retinopathy, bilateral: Secondary | ICD-10-CM

## 2018-12-18 DIAGNOSIS — H43813 Vitreous degeneration, bilateral: Secondary | ICD-10-CM

## 2019-01-15 ENCOUNTER — Encounter (INDEPENDENT_AMBULATORY_CARE_PROVIDER_SITE_OTHER): Payer: Medicare Other | Admitting: Ophthalmology

## 2019-01-15 ENCOUNTER — Other Ambulatory Visit: Payer: Self-pay

## 2019-01-15 DIAGNOSIS — H35033 Hypertensive retinopathy, bilateral: Secondary | ICD-10-CM | POA: Diagnosis not present

## 2019-01-15 DIAGNOSIS — H2513 Age-related nuclear cataract, bilateral: Secondary | ICD-10-CM

## 2019-01-15 DIAGNOSIS — I10 Essential (primary) hypertension: Secondary | ICD-10-CM

## 2019-01-15 DIAGNOSIS — H43813 Vitreous degeneration, bilateral: Secondary | ICD-10-CM | POA: Diagnosis not present

## 2019-01-15 DIAGNOSIS — H353112 Nonexudative age-related macular degeneration, right eye, intermediate dry stage: Secondary | ICD-10-CM | POA: Diagnosis not present

## 2019-01-15 DIAGNOSIS — H353231 Exudative age-related macular degeneration, bilateral, with active choroidal neovascularization: Secondary | ICD-10-CM

## 2019-02-12 ENCOUNTER — Other Ambulatory Visit: Payer: Self-pay

## 2019-02-12 ENCOUNTER — Encounter (INDEPENDENT_AMBULATORY_CARE_PROVIDER_SITE_OTHER): Payer: Medicare Other | Admitting: Ophthalmology

## 2019-02-12 DIAGNOSIS — H43813 Vitreous degeneration, bilateral: Secondary | ICD-10-CM | POA: Diagnosis not present

## 2019-02-12 DIAGNOSIS — H35033 Hypertensive retinopathy, bilateral: Secondary | ICD-10-CM | POA: Diagnosis not present

## 2019-02-12 DIAGNOSIS — H353221 Exudative age-related macular degeneration, left eye, with active choroidal neovascularization: Secondary | ICD-10-CM

## 2019-02-12 DIAGNOSIS — I10 Essential (primary) hypertension: Secondary | ICD-10-CM | POA: Diagnosis not present

## 2019-02-12 DIAGNOSIS — H2513 Age-related nuclear cataract, bilateral: Secondary | ICD-10-CM | POA: Diagnosis not present

## 2019-02-12 DIAGNOSIS — H353111 Nonexudative age-related macular degeneration, right eye, early dry stage: Secondary | ICD-10-CM

## 2019-02-27 DIAGNOSIS — I10 Essential (primary) hypertension: Secondary | ICD-10-CM | POA: Diagnosis not present

## 2019-02-27 DIAGNOSIS — E7849 Other hyperlipidemia: Secondary | ICD-10-CM | POA: Diagnosis not present

## 2019-02-27 DIAGNOSIS — R7301 Impaired fasting glucose: Secondary | ICD-10-CM | POA: Diagnosis not present

## 2019-02-27 DIAGNOSIS — R82998 Other abnormal findings in urine: Secondary | ICD-10-CM | POA: Diagnosis not present

## 2019-03-06 DIAGNOSIS — Z1331 Encounter for screening for depression: Secondary | ICD-10-CM | POA: Diagnosis not present

## 2019-03-06 DIAGNOSIS — R7301 Impaired fasting glucose: Secondary | ICD-10-CM | POA: Diagnosis not present

## 2019-03-06 DIAGNOSIS — K219 Gastro-esophageal reflux disease without esophagitis: Secondary | ICD-10-CM | POA: Diagnosis not present

## 2019-03-06 DIAGNOSIS — I10 Essential (primary) hypertension: Secondary | ICD-10-CM | POA: Diagnosis not present

## 2019-03-06 DIAGNOSIS — E785 Hyperlipidemia, unspecified: Secondary | ICD-10-CM | POA: Diagnosis not present

## 2019-03-06 DIAGNOSIS — N390 Urinary tract infection, site not specified: Secondary | ICD-10-CM | POA: Diagnosis not present

## 2019-03-06 DIAGNOSIS — H8110 Benign paroxysmal vertigo, unspecified ear: Secondary | ICD-10-CM | POA: Diagnosis not present

## 2019-03-06 DIAGNOSIS — Z1339 Encounter for screening examination for other mental health and behavioral disorders: Secondary | ICD-10-CM | POA: Diagnosis not present

## 2019-03-06 DIAGNOSIS — M545 Low back pain: Secondary | ICD-10-CM | POA: Diagnosis not present

## 2019-03-06 DIAGNOSIS — E669 Obesity, unspecified: Secondary | ICD-10-CM | POA: Diagnosis not present

## 2019-03-06 DIAGNOSIS — F419 Anxiety disorder, unspecified: Secondary | ICD-10-CM | POA: Diagnosis not present

## 2019-03-06 DIAGNOSIS — Z0000 Encounter for general adult medical examination without abnormal findings: Secondary | ICD-10-CM | POA: Diagnosis not present

## 2019-03-19 ENCOUNTER — Other Ambulatory Visit: Payer: Self-pay

## 2019-03-19 ENCOUNTER — Encounter (INDEPENDENT_AMBULATORY_CARE_PROVIDER_SITE_OTHER): Payer: Medicare Other | Admitting: Ophthalmology

## 2019-03-19 DIAGNOSIS — H353112 Nonexudative age-related macular degeneration, right eye, intermediate dry stage: Secondary | ICD-10-CM | POA: Diagnosis not present

## 2019-03-19 DIAGNOSIS — H43813 Vitreous degeneration, bilateral: Secondary | ICD-10-CM

## 2019-03-19 DIAGNOSIS — I10 Essential (primary) hypertension: Secondary | ICD-10-CM

## 2019-03-19 DIAGNOSIS — H2513 Age-related nuclear cataract, bilateral: Secondary | ICD-10-CM | POA: Diagnosis not present

## 2019-03-19 DIAGNOSIS — H353221 Exudative age-related macular degeneration, left eye, with active choroidal neovascularization: Secondary | ICD-10-CM | POA: Diagnosis not present

## 2019-03-19 DIAGNOSIS — H35033 Hypertensive retinopathy, bilateral: Secondary | ICD-10-CM

## 2019-04-23 ENCOUNTER — Other Ambulatory Visit: Payer: Self-pay

## 2019-04-23 ENCOUNTER — Encounter (INDEPENDENT_AMBULATORY_CARE_PROVIDER_SITE_OTHER): Payer: Medicare Other | Admitting: Ophthalmology

## 2019-04-23 DIAGNOSIS — I10 Essential (primary) hypertension: Secondary | ICD-10-CM

## 2019-04-23 DIAGNOSIS — H43813 Vitreous degeneration, bilateral: Secondary | ICD-10-CM

## 2019-04-23 DIAGNOSIS — H353221 Exudative age-related macular degeneration, left eye, with active choroidal neovascularization: Secondary | ICD-10-CM | POA: Diagnosis not present

## 2019-04-23 DIAGNOSIS — H353111 Nonexudative age-related macular degeneration, right eye, early dry stage: Secondary | ICD-10-CM

## 2019-04-23 DIAGNOSIS — H35033 Hypertensive retinopathy, bilateral: Secondary | ICD-10-CM

## 2019-04-23 DIAGNOSIS — H2513 Age-related nuclear cataract, bilateral: Secondary | ICD-10-CM | POA: Diagnosis not present

## 2019-06-04 ENCOUNTER — Encounter (INDEPENDENT_AMBULATORY_CARE_PROVIDER_SITE_OTHER): Payer: Medicare Other | Admitting: Ophthalmology

## 2019-06-04 ENCOUNTER — Other Ambulatory Visit: Payer: Self-pay

## 2019-06-04 DIAGNOSIS — H353111 Nonexudative age-related macular degeneration, right eye, early dry stage: Secondary | ICD-10-CM | POA: Diagnosis not present

## 2019-06-04 DIAGNOSIS — H353221 Exudative age-related macular degeneration, left eye, with active choroidal neovascularization: Secondary | ICD-10-CM | POA: Diagnosis not present

## 2019-06-04 DIAGNOSIS — H35033 Hypertensive retinopathy, bilateral: Secondary | ICD-10-CM

## 2019-06-04 DIAGNOSIS — H43813 Vitreous degeneration, bilateral: Secondary | ICD-10-CM | POA: Diagnosis not present

## 2019-06-04 DIAGNOSIS — H2513 Age-related nuclear cataract, bilateral: Secondary | ICD-10-CM

## 2019-06-04 DIAGNOSIS — I10 Essential (primary) hypertension: Secondary | ICD-10-CM | POA: Diagnosis not present

## 2019-07-16 ENCOUNTER — Encounter (INDEPENDENT_AMBULATORY_CARE_PROVIDER_SITE_OTHER): Payer: Medicare Other | Admitting: Ophthalmology

## 2019-07-16 ENCOUNTER — Other Ambulatory Visit: Payer: Self-pay

## 2019-07-16 DIAGNOSIS — H43813 Vitreous degeneration, bilateral: Secondary | ICD-10-CM | POA: Diagnosis not present

## 2019-07-16 DIAGNOSIS — H353221 Exudative age-related macular degeneration, left eye, with active choroidal neovascularization: Secondary | ICD-10-CM

## 2019-07-16 DIAGNOSIS — I10 Essential (primary) hypertension: Secondary | ICD-10-CM

## 2019-07-16 DIAGNOSIS — H35033 Hypertensive retinopathy, bilateral: Secondary | ICD-10-CM | POA: Diagnosis not present

## 2019-07-16 DIAGNOSIS — H2513 Age-related nuclear cataract, bilateral: Secondary | ICD-10-CM | POA: Diagnosis not present

## 2019-07-16 DIAGNOSIS — H353111 Nonexudative age-related macular degeneration, right eye, early dry stage: Secondary | ICD-10-CM

## 2019-07-21 DIAGNOSIS — Z23 Encounter for immunization: Secondary | ICD-10-CM | POA: Diagnosis not present

## 2019-08-27 ENCOUNTER — Encounter (INDEPENDENT_AMBULATORY_CARE_PROVIDER_SITE_OTHER): Payer: Medicare Other | Admitting: Ophthalmology

## 2019-08-27 DIAGNOSIS — H35033 Hypertensive retinopathy, bilateral: Secondary | ICD-10-CM | POA: Diagnosis not present

## 2019-08-27 DIAGNOSIS — I10 Essential (primary) hypertension: Secondary | ICD-10-CM

## 2019-08-27 DIAGNOSIS — H353111 Nonexudative age-related macular degeneration, right eye, early dry stage: Secondary | ICD-10-CM | POA: Diagnosis not present

## 2019-08-27 DIAGNOSIS — H2513 Age-related nuclear cataract, bilateral: Secondary | ICD-10-CM

## 2019-08-27 DIAGNOSIS — H43813 Vitreous degeneration, bilateral: Secondary | ICD-10-CM

## 2019-08-27 DIAGNOSIS — H353221 Exudative age-related macular degeneration, left eye, with active choroidal neovascularization: Secondary | ICD-10-CM

## 2019-10-08 ENCOUNTER — Encounter (INDEPENDENT_AMBULATORY_CARE_PROVIDER_SITE_OTHER): Payer: Medicare Other | Admitting: Ophthalmology

## 2019-10-08 DIAGNOSIS — H43813 Vitreous degeneration, bilateral: Secondary | ICD-10-CM

## 2019-10-08 DIAGNOSIS — H353221 Exudative age-related macular degeneration, left eye, with active choroidal neovascularization: Secondary | ICD-10-CM

## 2019-10-08 DIAGNOSIS — H2513 Age-related nuclear cataract, bilateral: Secondary | ICD-10-CM

## 2019-10-08 DIAGNOSIS — H35033 Hypertensive retinopathy, bilateral: Secondary | ICD-10-CM | POA: Diagnosis not present

## 2019-10-08 DIAGNOSIS — H353111 Nonexudative age-related macular degeneration, right eye, early dry stage: Secondary | ICD-10-CM

## 2019-10-08 DIAGNOSIS — I10 Essential (primary) hypertension: Secondary | ICD-10-CM | POA: Diagnosis not present

## 2019-11-19 ENCOUNTER — Other Ambulatory Visit: Payer: Self-pay

## 2019-11-19 ENCOUNTER — Encounter (INDEPENDENT_AMBULATORY_CARE_PROVIDER_SITE_OTHER): Payer: Medicare Other | Admitting: Ophthalmology

## 2019-11-19 DIAGNOSIS — I10 Essential (primary) hypertension: Secondary | ICD-10-CM

## 2019-11-19 DIAGNOSIS — H43813 Vitreous degeneration, bilateral: Secondary | ICD-10-CM | POA: Diagnosis not present

## 2019-11-19 DIAGNOSIS — H353111 Nonexudative age-related macular degeneration, right eye, early dry stage: Secondary | ICD-10-CM

## 2019-11-19 DIAGNOSIS — H35033 Hypertensive retinopathy, bilateral: Secondary | ICD-10-CM | POA: Diagnosis not present

## 2019-11-19 DIAGNOSIS — H353221 Exudative age-related macular degeneration, left eye, with active choroidal neovascularization: Secondary | ICD-10-CM

## 2019-11-19 DIAGNOSIS — H2513 Age-related nuclear cataract, bilateral: Secondary | ICD-10-CM

## 2019-12-31 ENCOUNTER — Encounter (INDEPENDENT_AMBULATORY_CARE_PROVIDER_SITE_OTHER): Payer: Medicare Other | Admitting: Ophthalmology

## 2019-12-31 DIAGNOSIS — H353221 Exudative age-related macular degeneration, left eye, with active choroidal neovascularization: Secondary | ICD-10-CM | POA: Diagnosis not present

## 2019-12-31 DIAGNOSIS — H43813 Vitreous degeneration, bilateral: Secondary | ICD-10-CM

## 2019-12-31 DIAGNOSIS — H2513 Age-related nuclear cataract, bilateral: Secondary | ICD-10-CM

## 2019-12-31 DIAGNOSIS — H353111 Nonexudative age-related macular degeneration, right eye, early dry stage: Secondary | ICD-10-CM | POA: Diagnosis not present

## 2019-12-31 DIAGNOSIS — H35033 Hypertensive retinopathy, bilateral: Secondary | ICD-10-CM | POA: Diagnosis not present

## 2019-12-31 DIAGNOSIS — I10 Essential (primary) hypertension: Secondary | ICD-10-CM

## 2020-02-11 ENCOUNTER — Encounter (INDEPENDENT_AMBULATORY_CARE_PROVIDER_SITE_OTHER): Payer: Medicare Other | Admitting: Ophthalmology

## 2020-02-11 DIAGNOSIS — H43813 Vitreous degeneration, bilateral: Secondary | ICD-10-CM | POA: Diagnosis not present

## 2020-02-11 DIAGNOSIS — H353111 Nonexudative age-related macular degeneration, right eye, early dry stage: Secondary | ICD-10-CM

## 2020-02-11 DIAGNOSIS — H35033 Hypertensive retinopathy, bilateral: Secondary | ICD-10-CM

## 2020-02-11 DIAGNOSIS — I10 Essential (primary) hypertension: Secondary | ICD-10-CM

## 2020-02-11 DIAGNOSIS — H353221 Exudative age-related macular degeneration, left eye, with active choroidal neovascularization: Secondary | ICD-10-CM | POA: Diagnosis not present

## 2020-03-11 DIAGNOSIS — E7849 Other hyperlipidemia: Secondary | ICD-10-CM | POA: Diagnosis not present

## 2020-03-18 DIAGNOSIS — R7301 Impaired fasting glucose: Secondary | ICD-10-CM | POA: Diagnosis not present

## 2020-03-18 DIAGNOSIS — G4700 Insomnia, unspecified: Secondary | ICD-10-CM | POA: Diagnosis not present

## 2020-03-18 DIAGNOSIS — R635 Abnormal weight gain: Secondary | ICD-10-CM | POA: Diagnosis not present

## 2020-03-18 DIAGNOSIS — E7849 Other hyperlipidemia: Secondary | ICD-10-CM | POA: Diagnosis not present

## 2020-03-18 DIAGNOSIS — R82998 Other abnormal findings in urine: Secondary | ICD-10-CM | POA: Diagnosis not present

## 2020-03-18 DIAGNOSIS — I10 Essential (primary) hypertension: Secondary | ICD-10-CM | POA: Diagnosis not present

## 2020-03-18 DIAGNOSIS — Z1331 Encounter for screening for depression: Secondary | ICD-10-CM | POA: Diagnosis not present

## 2020-03-18 DIAGNOSIS — H8109 Meniere's disease, unspecified ear: Secondary | ICD-10-CM | POA: Diagnosis not present

## 2020-03-18 DIAGNOSIS — F419 Anxiety disorder, unspecified: Secondary | ICD-10-CM | POA: Diagnosis not present

## 2020-03-18 DIAGNOSIS — E669 Obesity, unspecified: Secondary | ICD-10-CM | POA: Diagnosis not present

## 2020-03-18 DIAGNOSIS — Z0000 Encounter for general adult medical examination without abnormal findings: Secondary | ICD-10-CM | POA: Diagnosis not present

## 2020-03-18 DIAGNOSIS — M545 Low back pain: Secondary | ICD-10-CM | POA: Diagnosis not present

## 2020-03-20 DIAGNOSIS — Z1212 Encounter for screening for malignant neoplasm of rectum: Secondary | ICD-10-CM | POA: Diagnosis not present

## 2020-03-24 ENCOUNTER — Other Ambulatory Visit: Payer: Self-pay

## 2020-03-24 ENCOUNTER — Encounter (INDEPENDENT_AMBULATORY_CARE_PROVIDER_SITE_OTHER): Payer: Medicare Other | Admitting: Ophthalmology

## 2020-03-24 DIAGNOSIS — H353112 Nonexudative age-related macular degeneration, right eye, intermediate dry stage: Secondary | ICD-10-CM

## 2020-03-24 DIAGNOSIS — H43813 Vitreous degeneration, bilateral: Secondary | ICD-10-CM

## 2020-03-24 DIAGNOSIS — H353221 Exudative age-related macular degeneration, left eye, with active choroidal neovascularization: Secondary | ICD-10-CM | POA: Diagnosis not present

## 2020-03-24 DIAGNOSIS — I10 Essential (primary) hypertension: Secondary | ICD-10-CM

## 2020-03-24 DIAGNOSIS — H35033 Hypertensive retinopathy, bilateral: Secondary | ICD-10-CM | POA: Diagnosis not present

## 2020-05-12 ENCOUNTER — Other Ambulatory Visit: Payer: Self-pay

## 2020-05-12 ENCOUNTER — Encounter (INDEPENDENT_AMBULATORY_CARE_PROVIDER_SITE_OTHER): Payer: Medicare Other | Admitting: Ophthalmology

## 2020-05-12 DIAGNOSIS — H353221 Exudative age-related macular degeneration, left eye, with active choroidal neovascularization: Secondary | ICD-10-CM | POA: Diagnosis not present

## 2020-05-12 DIAGNOSIS — H43813 Vitreous degeneration, bilateral: Secondary | ICD-10-CM

## 2020-05-12 DIAGNOSIS — H2513 Age-related nuclear cataract, bilateral: Secondary | ICD-10-CM | POA: Diagnosis not present

## 2020-05-12 DIAGNOSIS — H353112 Nonexudative age-related macular degeneration, right eye, intermediate dry stage: Secondary | ICD-10-CM

## 2020-05-12 DIAGNOSIS — H35033 Hypertensive retinopathy, bilateral: Secondary | ICD-10-CM

## 2020-05-12 DIAGNOSIS — I10 Essential (primary) hypertension: Secondary | ICD-10-CM

## 2020-06-24 ENCOUNTER — Other Ambulatory Visit: Payer: Self-pay

## 2020-06-24 ENCOUNTER — Encounter (INDEPENDENT_AMBULATORY_CARE_PROVIDER_SITE_OTHER): Payer: Medicare Other | Admitting: Ophthalmology

## 2020-06-24 DIAGNOSIS — H353221 Exudative age-related macular degeneration, left eye, with active choroidal neovascularization: Secondary | ICD-10-CM | POA: Diagnosis not present

## 2020-06-24 DIAGNOSIS — H353112 Nonexudative age-related macular degeneration, right eye, intermediate dry stage: Secondary | ICD-10-CM | POA: Diagnosis not present

## 2020-06-24 DIAGNOSIS — H35033 Hypertensive retinopathy, bilateral: Secondary | ICD-10-CM | POA: Diagnosis not present

## 2020-06-24 DIAGNOSIS — I10 Essential (primary) hypertension: Secondary | ICD-10-CM

## 2020-06-24 DIAGNOSIS — H43813 Vitreous degeneration, bilateral: Secondary | ICD-10-CM

## 2020-07-14 DIAGNOSIS — Z23 Encounter for immunization: Secondary | ICD-10-CM | POA: Diagnosis not present

## 2020-07-19 DIAGNOSIS — Z23 Encounter for immunization: Secondary | ICD-10-CM | POA: Diagnosis not present

## 2020-08-04 ENCOUNTER — Other Ambulatory Visit: Payer: Self-pay

## 2020-08-04 ENCOUNTER — Encounter (INDEPENDENT_AMBULATORY_CARE_PROVIDER_SITE_OTHER): Payer: Medicare Other | Admitting: Ophthalmology

## 2020-08-04 DIAGNOSIS — H353111 Nonexudative age-related macular degeneration, right eye, early dry stage: Secondary | ICD-10-CM | POA: Diagnosis not present

## 2020-08-04 DIAGNOSIS — H35033 Hypertensive retinopathy, bilateral: Secondary | ICD-10-CM

## 2020-08-04 DIAGNOSIS — H353221 Exudative age-related macular degeneration, left eye, with active choroidal neovascularization: Secondary | ICD-10-CM | POA: Diagnosis not present

## 2020-08-04 DIAGNOSIS — H43813 Vitreous degeneration, bilateral: Secondary | ICD-10-CM

## 2020-08-04 DIAGNOSIS — I10 Essential (primary) hypertension: Secondary | ICD-10-CM | POA: Diagnosis not present

## 2020-09-15 ENCOUNTER — Other Ambulatory Visit: Payer: Self-pay

## 2020-09-15 ENCOUNTER — Encounter (INDEPENDENT_AMBULATORY_CARE_PROVIDER_SITE_OTHER): Payer: Medicare Other | Admitting: Ophthalmology

## 2020-09-15 DIAGNOSIS — H43813 Vitreous degeneration, bilateral: Secondary | ICD-10-CM

## 2020-09-15 DIAGNOSIS — H353221 Exudative age-related macular degeneration, left eye, with active choroidal neovascularization: Secondary | ICD-10-CM | POA: Diagnosis not present

## 2020-09-15 DIAGNOSIS — H35033 Hypertensive retinopathy, bilateral: Secondary | ICD-10-CM

## 2020-09-15 DIAGNOSIS — H353112 Nonexudative age-related macular degeneration, right eye, intermediate dry stage: Secondary | ICD-10-CM

## 2020-09-15 DIAGNOSIS — E10319 Type 1 diabetes mellitus with unspecified diabetic retinopathy without macular edema: Secondary | ICD-10-CM

## 2020-11-03 ENCOUNTER — Encounter (INDEPENDENT_AMBULATORY_CARE_PROVIDER_SITE_OTHER): Payer: Medicare Other | Admitting: Ophthalmology

## 2020-11-04 ENCOUNTER — Encounter (INDEPENDENT_AMBULATORY_CARE_PROVIDER_SITE_OTHER): Payer: Medicare Other | Admitting: Ophthalmology

## 2020-11-04 ENCOUNTER — Other Ambulatory Visit: Payer: Self-pay

## 2020-11-04 DIAGNOSIS — I10 Essential (primary) hypertension: Secondary | ICD-10-CM

## 2020-11-04 DIAGNOSIS — H353112 Nonexudative age-related macular degeneration, right eye, intermediate dry stage: Secondary | ICD-10-CM

## 2020-11-04 DIAGNOSIS — H43813 Vitreous degeneration, bilateral: Secondary | ICD-10-CM

## 2020-11-04 DIAGNOSIS — H35033 Hypertensive retinopathy, bilateral: Secondary | ICD-10-CM | POA: Diagnosis not present

## 2020-11-04 DIAGNOSIS — H353221 Exudative age-related macular degeneration, left eye, with active choroidal neovascularization: Secondary | ICD-10-CM

## 2020-12-22 ENCOUNTER — Encounter (INDEPENDENT_AMBULATORY_CARE_PROVIDER_SITE_OTHER): Payer: Medicare Other | Admitting: Ophthalmology

## 2020-12-22 ENCOUNTER — Other Ambulatory Visit: Payer: Self-pay

## 2020-12-22 DIAGNOSIS — H43813 Vitreous degeneration, bilateral: Secondary | ICD-10-CM

## 2020-12-22 DIAGNOSIS — H353221 Exudative age-related macular degeneration, left eye, with active choroidal neovascularization: Secondary | ICD-10-CM

## 2020-12-22 DIAGNOSIS — H35033 Hypertensive retinopathy, bilateral: Secondary | ICD-10-CM | POA: Diagnosis not present

## 2020-12-22 DIAGNOSIS — H353112 Nonexudative age-related macular degeneration, right eye, intermediate dry stage: Secondary | ICD-10-CM | POA: Diagnosis not present

## 2020-12-22 DIAGNOSIS — I10 Essential (primary) hypertension: Secondary | ICD-10-CM | POA: Diagnosis not present

## 2021-02-02 DIAGNOSIS — Z23 Encounter for immunization: Secondary | ICD-10-CM | POA: Diagnosis not present

## 2021-02-09 ENCOUNTER — Encounter (INDEPENDENT_AMBULATORY_CARE_PROVIDER_SITE_OTHER): Payer: Medicare Other | Admitting: Ophthalmology

## 2021-02-09 ENCOUNTER — Other Ambulatory Visit: Payer: Self-pay

## 2021-02-09 DIAGNOSIS — I10 Essential (primary) hypertension: Secondary | ICD-10-CM | POA: Diagnosis not present

## 2021-02-09 DIAGNOSIS — H35033 Hypertensive retinopathy, bilateral: Secondary | ICD-10-CM | POA: Diagnosis not present

## 2021-02-09 DIAGNOSIS — H353112 Nonexudative age-related macular degeneration, right eye, intermediate dry stage: Secondary | ICD-10-CM

## 2021-02-09 DIAGNOSIS — H43813 Vitreous degeneration, bilateral: Secondary | ICD-10-CM

## 2021-02-09 DIAGNOSIS — H353221 Exudative age-related macular degeneration, left eye, with active choroidal neovascularization: Secondary | ICD-10-CM

## 2021-03-30 ENCOUNTER — Encounter (INDEPENDENT_AMBULATORY_CARE_PROVIDER_SITE_OTHER): Payer: Medicare Other | Admitting: Ophthalmology

## 2021-03-30 ENCOUNTER — Other Ambulatory Visit: Payer: Self-pay

## 2021-03-30 DIAGNOSIS — H35033 Hypertensive retinopathy, bilateral: Secondary | ICD-10-CM

## 2021-03-30 DIAGNOSIS — I10 Essential (primary) hypertension: Secondary | ICD-10-CM | POA: Diagnosis not present

## 2021-03-30 DIAGNOSIS — H2513 Age-related nuclear cataract, bilateral: Secondary | ICD-10-CM | POA: Diagnosis not present

## 2021-03-30 DIAGNOSIS — H43813 Vitreous degeneration, bilateral: Secondary | ICD-10-CM | POA: Diagnosis not present

## 2021-03-30 DIAGNOSIS — H353112 Nonexudative age-related macular degeneration, right eye, intermediate dry stage: Secondary | ICD-10-CM

## 2021-03-30 DIAGNOSIS — H353221 Exudative age-related macular degeneration, left eye, with active choroidal neovascularization: Secondary | ICD-10-CM

## 2021-03-31 DIAGNOSIS — Z0000 Encounter for general adult medical examination without abnormal findings: Secondary | ICD-10-CM | POA: Diagnosis not present

## 2021-03-31 DIAGNOSIS — E785 Hyperlipidemia, unspecified: Secondary | ICD-10-CM | POA: Diagnosis not present

## 2021-03-31 DIAGNOSIS — R7301 Impaired fasting glucose: Secondary | ICD-10-CM | POA: Diagnosis not present

## 2021-04-07 DIAGNOSIS — I10 Essential (primary) hypertension: Secondary | ICD-10-CM | POA: Diagnosis not present

## 2021-04-07 DIAGNOSIS — Z0000 Encounter for general adult medical examination without abnormal findings: Secondary | ICD-10-CM | POA: Diagnosis not present

## 2021-04-07 DIAGNOSIS — H8109 Meniere's disease, unspecified ear: Secondary | ICD-10-CM | POA: Diagnosis not present

## 2021-04-07 DIAGNOSIS — K219 Gastro-esophageal reflux disease without esophagitis: Secondary | ICD-10-CM | POA: Diagnosis not present

## 2021-04-07 DIAGNOSIS — F419 Anxiety disorder, unspecified: Secondary | ICD-10-CM | POA: Diagnosis not present

## 2021-04-07 DIAGNOSIS — M1990 Unspecified osteoarthritis, unspecified site: Secondary | ICD-10-CM | POA: Diagnosis not present

## 2021-04-07 DIAGNOSIS — E785 Hyperlipidemia, unspecified: Secondary | ICD-10-CM | POA: Diagnosis not present

## 2021-04-07 DIAGNOSIS — G4700 Insomnia, unspecified: Secondary | ICD-10-CM | POA: Diagnosis not present

## 2021-04-07 DIAGNOSIS — Z1212 Encounter for screening for malignant neoplasm of rectum: Secondary | ICD-10-CM | POA: Diagnosis not present

## 2021-04-07 DIAGNOSIS — R7301 Impaired fasting glucose: Secondary | ICD-10-CM | POA: Diagnosis not present

## 2021-04-13 ENCOUNTER — Other Ambulatory Visit: Payer: Self-pay | Admitting: Internal Medicine

## 2021-04-13 DIAGNOSIS — Z1231 Encounter for screening mammogram for malignant neoplasm of breast: Secondary | ICD-10-CM

## 2021-04-30 DIAGNOSIS — E785 Hyperlipidemia, unspecified: Secondary | ICD-10-CM | POA: Diagnosis not present

## 2021-05-11 ENCOUNTER — Other Ambulatory Visit: Payer: Self-pay

## 2021-05-11 ENCOUNTER — Encounter (INDEPENDENT_AMBULATORY_CARE_PROVIDER_SITE_OTHER): Payer: Medicare Other | Admitting: Ophthalmology

## 2021-05-11 DIAGNOSIS — H353111 Nonexudative age-related macular degeneration, right eye, early dry stage: Secondary | ICD-10-CM | POA: Diagnosis not present

## 2021-05-11 DIAGNOSIS — H35033 Hypertensive retinopathy, bilateral: Secondary | ICD-10-CM

## 2021-05-11 DIAGNOSIS — H43813 Vitreous degeneration, bilateral: Secondary | ICD-10-CM | POA: Diagnosis not present

## 2021-05-11 DIAGNOSIS — H2513 Age-related nuclear cataract, bilateral: Secondary | ICD-10-CM | POA: Diagnosis not present

## 2021-05-11 DIAGNOSIS — I10 Essential (primary) hypertension: Secondary | ICD-10-CM

## 2021-05-11 DIAGNOSIS — H353221 Exudative age-related macular degeneration, left eye, with active choroidal neovascularization: Secondary | ICD-10-CM

## 2021-06-02 DIAGNOSIS — I10 Essential (primary) hypertension: Secondary | ICD-10-CM | POA: Diagnosis not present

## 2021-06-02 DIAGNOSIS — E785 Hyperlipidemia, unspecified: Secondary | ICD-10-CM | POA: Diagnosis not present

## 2021-06-02 DIAGNOSIS — R7301 Impaired fasting glucose: Secondary | ICD-10-CM | POA: Diagnosis not present

## 2021-06-02 DIAGNOSIS — E669 Obesity, unspecified: Secondary | ICD-10-CM | POA: Diagnosis not present

## 2021-06-29 ENCOUNTER — Other Ambulatory Visit: Payer: Self-pay

## 2021-06-29 ENCOUNTER — Encounter (INDEPENDENT_AMBULATORY_CARE_PROVIDER_SITE_OTHER): Payer: Medicare Other | Admitting: Ophthalmology

## 2021-06-29 DIAGNOSIS — H353112 Nonexudative age-related macular degeneration, right eye, intermediate dry stage: Secondary | ICD-10-CM

## 2021-06-29 DIAGNOSIS — H353221 Exudative age-related macular degeneration, left eye, with active choroidal neovascularization: Secondary | ICD-10-CM | POA: Diagnosis not present

## 2021-06-29 DIAGNOSIS — I10 Essential (primary) hypertension: Secondary | ICD-10-CM

## 2021-06-29 DIAGNOSIS — H43813 Vitreous degeneration, bilateral: Secondary | ICD-10-CM | POA: Diagnosis not present

## 2021-06-29 DIAGNOSIS — H35033 Hypertensive retinopathy, bilateral: Secondary | ICD-10-CM

## 2021-07-06 DIAGNOSIS — Z23 Encounter for immunization: Secondary | ICD-10-CM | POA: Diagnosis not present

## 2021-08-08 DIAGNOSIS — Z23 Encounter for immunization: Secondary | ICD-10-CM | POA: Diagnosis not present

## 2021-08-13 ENCOUNTER — Other Ambulatory Visit: Payer: Self-pay | Admitting: Internal Medicine

## 2021-08-13 DIAGNOSIS — Z1231 Encounter for screening mammogram for malignant neoplasm of breast: Secondary | ICD-10-CM

## 2021-08-17 ENCOUNTER — Encounter (INDEPENDENT_AMBULATORY_CARE_PROVIDER_SITE_OTHER): Payer: Medicare Other | Admitting: Ophthalmology

## 2021-08-17 ENCOUNTER — Other Ambulatory Visit: Payer: Self-pay

## 2021-08-17 DIAGNOSIS — H43813 Vitreous degeneration, bilateral: Secondary | ICD-10-CM | POA: Diagnosis not present

## 2021-08-17 DIAGNOSIS — H35033 Hypertensive retinopathy, bilateral: Secondary | ICD-10-CM

## 2021-08-17 DIAGNOSIS — H353111 Nonexudative age-related macular degeneration, right eye, early dry stage: Secondary | ICD-10-CM

## 2021-08-17 DIAGNOSIS — H353221 Exudative age-related macular degeneration, left eye, with active choroidal neovascularization: Secondary | ICD-10-CM

## 2021-08-17 DIAGNOSIS — I10 Essential (primary) hypertension: Secondary | ICD-10-CM

## 2021-08-28 ENCOUNTER — Ambulatory Visit
Admission: RE | Admit: 2021-08-28 | Discharge: 2021-08-28 | Disposition: A | Discharge status: Home / Self Care | Payer: Medicare Other | Source: Ambulatory Visit | Attending: Internal Medicine | Admitting: Internal Medicine

## 2021-08-28 ENCOUNTER — Other Ambulatory Visit: Payer: Self-pay

## 2021-08-28 DIAGNOSIS — Z1231 Encounter for screening mammogram for malignant neoplasm of breast: Secondary | ICD-10-CM | POA: Diagnosis not present

## 2021-09-03 DIAGNOSIS — E785 Hyperlipidemia, unspecified: Secondary | ICD-10-CM | POA: Diagnosis not present

## 2021-09-03 DIAGNOSIS — I10 Essential (primary) hypertension: Secondary | ICD-10-CM | POA: Diagnosis not present

## 2021-09-03 DIAGNOSIS — R7301 Impaired fasting glucose: Secondary | ICD-10-CM | POA: Diagnosis not present

## 2021-09-03 DIAGNOSIS — E669 Obesity, unspecified: Secondary | ICD-10-CM | POA: Diagnosis not present

## 2021-10-05 ENCOUNTER — Encounter (INDEPENDENT_AMBULATORY_CARE_PROVIDER_SITE_OTHER): Payer: Medicare Other | Admitting: Ophthalmology

## 2021-10-05 ENCOUNTER — Other Ambulatory Visit: Payer: Self-pay

## 2021-10-05 DIAGNOSIS — H353111 Nonexudative age-related macular degeneration, right eye, early dry stage: Secondary | ICD-10-CM

## 2021-10-05 DIAGNOSIS — I10 Essential (primary) hypertension: Secondary | ICD-10-CM | POA: Diagnosis not present

## 2021-10-05 DIAGNOSIS — H353221 Exudative age-related macular degeneration, left eye, with active choroidal neovascularization: Secondary | ICD-10-CM | POA: Diagnosis not present

## 2021-10-05 DIAGNOSIS — H43813 Vitreous degeneration, bilateral: Secondary | ICD-10-CM

## 2021-10-05 DIAGNOSIS — H35033 Hypertensive retinopathy, bilateral: Secondary | ICD-10-CM | POA: Diagnosis not present

## 2021-10-14 DIAGNOSIS — Z209 Contact with and (suspected) exposure to unspecified communicable disease: Secondary | ICD-10-CM | POA: Diagnosis not present

## 2021-10-14 DIAGNOSIS — Z2914 Encounter for prophylactic rabies immune globin: Secondary | ICD-10-CM | POA: Diagnosis not present

## 2021-10-14 DIAGNOSIS — Z23 Encounter for immunization: Secondary | ICD-10-CM | POA: Diagnosis not present

## 2021-10-14 DIAGNOSIS — Z203 Contact with and (suspected) exposure to rabies: Secondary | ICD-10-CM | POA: Diagnosis not present

## 2021-10-17 DIAGNOSIS — Z23 Encounter for immunization: Secondary | ICD-10-CM | POA: Diagnosis not present

## 2021-10-17 DIAGNOSIS — Z2914 Encounter for prophylactic rabies immune globin: Secondary | ICD-10-CM | POA: Diagnosis not present

## 2021-10-17 DIAGNOSIS — Z203 Contact with and (suspected) exposure to rabies: Secondary | ICD-10-CM | POA: Diagnosis not present

## 2021-10-21 DIAGNOSIS — Z203 Contact with and (suspected) exposure to rabies: Secondary | ICD-10-CM | POA: Diagnosis not present

## 2021-10-21 DIAGNOSIS — Z2914 Encounter for prophylactic rabies immune globin: Secondary | ICD-10-CM | POA: Diagnosis not present

## 2021-10-21 DIAGNOSIS — Z23 Encounter for immunization: Secondary | ICD-10-CM | POA: Diagnosis not present

## 2021-10-28 DIAGNOSIS — Z23 Encounter for immunization: Secondary | ICD-10-CM | POA: Diagnosis not present

## 2021-10-28 DIAGNOSIS — Z2914 Encounter for prophylactic rabies immune globin: Secondary | ICD-10-CM | POA: Diagnosis not present

## 2021-10-28 DIAGNOSIS — Z203 Contact with and (suspected) exposure to rabies: Secondary | ICD-10-CM | POA: Diagnosis not present

## 2021-11-09 DIAGNOSIS — H5203 Hypermetropia, bilateral: Secondary | ICD-10-CM | POA: Diagnosis not present

## 2021-11-09 DIAGNOSIS — H25813 Combined forms of age-related cataract, bilateral: Secondary | ICD-10-CM | POA: Diagnosis not present

## 2021-11-09 DIAGNOSIS — H353111 Nonexudative age-related macular degeneration, right eye, early dry stage: Secondary | ICD-10-CM | POA: Diagnosis not present

## 2021-11-09 DIAGNOSIS — H353221 Exudative age-related macular degeneration, left eye, with active choroidal neovascularization: Secondary | ICD-10-CM | POA: Diagnosis not present

## 2021-11-23 ENCOUNTER — Other Ambulatory Visit: Payer: Self-pay

## 2021-11-23 ENCOUNTER — Encounter (INDEPENDENT_AMBULATORY_CARE_PROVIDER_SITE_OTHER): Payer: Medicare Other | Admitting: Ophthalmology

## 2021-11-23 DIAGNOSIS — I10 Essential (primary) hypertension: Secondary | ICD-10-CM | POA: Diagnosis not present

## 2021-11-23 DIAGNOSIS — H43813 Vitreous degeneration, bilateral: Secondary | ICD-10-CM | POA: Diagnosis not present

## 2021-11-23 DIAGNOSIS — H2513 Age-related nuclear cataract, bilateral: Secondary | ICD-10-CM

## 2021-11-23 DIAGNOSIS — H353221 Exudative age-related macular degeneration, left eye, with active choroidal neovascularization: Secondary | ICD-10-CM | POA: Diagnosis not present

## 2021-11-23 DIAGNOSIS — H353111 Nonexudative age-related macular degeneration, right eye, early dry stage: Secondary | ICD-10-CM

## 2021-11-23 DIAGNOSIS — H35033 Hypertensive retinopathy, bilateral: Secondary | ICD-10-CM | POA: Diagnosis not present

## 2022-01-11 ENCOUNTER — Other Ambulatory Visit: Payer: Self-pay

## 2022-01-11 ENCOUNTER — Encounter (INDEPENDENT_AMBULATORY_CARE_PROVIDER_SITE_OTHER): Payer: Medicare Other | Admitting: Ophthalmology

## 2022-01-11 DIAGNOSIS — H43813 Vitreous degeneration, bilateral: Secondary | ICD-10-CM | POA: Diagnosis not present

## 2022-01-11 DIAGNOSIS — H353221 Exudative age-related macular degeneration, left eye, with active choroidal neovascularization: Secondary | ICD-10-CM

## 2022-01-11 DIAGNOSIS — I10 Essential (primary) hypertension: Secondary | ICD-10-CM | POA: Diagnosis not present

## 2022-01-11 DIAGNOSIS — H35033 Hypertensive retinopathy, bilateral: Secondary | ICD-10-CM | POA: Diagnosis not present

## 2022-01-11 DIAGNOSIS — H353112 Nonexudative age-related macular degeneration, right eye, intermediate dry stage: Secondary | ICD-10-CM | POA: Diagnosis not present

## 2022-03-01 ENCOUNTER — Encounter (INDEPENDENT_AMBULATORY_CARE_PROVIDER_SITE_OTHER): Payer: Medicare Other | Admitting: Ophthalmology

## 2022-03-01 DIAGNOSIS — I10 Essential (primary) hypertension: Secondary | ICD-10-CM

## 2022-03-01 DIAGNOSIS — H353221 Exudative age-related macular degeneration, left eye, with active choroidal neovascularization: Secondary | ICD-10-CM

## 2022-03-01 DIAGNOSIS — H43813 Vitreous degeneration, bilateral: Secondary | ICD-10-CM | POA: Diagnosis not present

## 2022-03-01 DIAGNOSIS — H35033 Hypertensive retinopathy, bilateral: Secondary | ICD-10-CM

## 2022-03-01 DIAGNOSIS — H353112 Nonexudative age-related macular degeneration, right eye, intermediate dry stage: Secondary | ICD-10-CM | POA: Diagnosis not present

## 2022-04-08 ENCOUNTER — Emergency Department (HOSPITAL_BASED_OUTPATIENT_CLINIC_OR_DEPARTMENT_OTHER): Payer: Medicare Other

## 2022-04-08 ENCOUNTER — Emergency Department (HOSPITAL_BASED_OUTPATIENT_CLINIC_OR_DEPARTMENT_OTHER)
Admission: EM | Admit: 2022-04-08 | Discharge: 2022-04-08 | Disposition: A | Discharge status: Home / Self Care | Payer: Medicare Other | Attending: Emergency Medicine | Admitting: Emergency Medicine

## 2022-04-08 ENCOUNTER — Other Ambulatory Visit: Payer: Self-pay

## 2022-04-08 ENCOUNTER — Encounter (HOSPITAL_BASED_OUTPATIENT_CLINIC_OR_DEPARTMENT_OTHER): Payer: Self-pay | Admitting: Emergency Medicine

## 2022-04-08 DIAGNOSIS — W01198A Fall on same level from slipping, tripping and stumbling with subsequent striking against other object, initial encounter: Secondary | ICD-10-CM | POA: Insufficient documentation

## 2022-04-08 DIAGNOSIS — I10 Essential (primary) hypertension: Secondary | ICD-10-CM | POA: Insufficient documentation

## 2022-04-08 DIAGNOSIS — W19XXXA Unspecified fall, initial encounter: Secondary | ICD-10-CM

## 2022-04-08 DIAGNOSIS — Y93K9 Activity, other involving animal care: Secondary | ICD-10-CM | POA: Insufficient documentation

## 2022-04-08 DIAGNOSIS — Z7982 Long term (current) use of aspirin: Secondary | ICD-10-CM | POA: Diagnosis not present

## 2022-04-08 DIAGNOSIS — S0990XA Unspecified injury of head, initial encounter: Secondary | ICD-10-CM | POA: Diagnosis not present

## 2022-04-08 DIAGNOSIS — Z79899 Other long term (current) drug therapy: Secondary | ICD-10-CM | POA: Diagnosis not present

## 2022-04-08 DIAGNOSIS — M4312 Spondylolisthesis, cervical region: Secondary | ICD-10-CM | POA: Diagnosis not present

## 2022-04-08 HISTORY — DX: Essential (primary) hypertension: I10

## 2022-04-08 HISTORY — DX: Unspecified disease of inner ear, unspecified ear: H83.90

## 2022-04-08 NOTE — ED Triage Notes (Signed)
Patient arrives POV ambulatory stating this morning when reaching down to pet her dog she fell over sideways hitting right side of her head on fireplace. No LOC. No blood thinners.

## 2022-04-19 ENCOUNTER — Encounter (INDEPENDENT_AMBULATORY_CARE_PROVIDER_SITE_OTHER): Payer: Medicare Other | Admitting: Ophthalmology

## 2022-04-19 DIAGNOSIS — H353112 Nonexudative age-related macular degeneration, right eye, intermediate dry stage: Secondary | ICD-10-CM

## 2022-04-19 DIAGNOSIS — H43813 Vitreous degeneration, bilateral: Secondary | ICD-10-CM

## 2022-04-19 DIAGNOSIS — I10 Essential (primary) hypertension: Secondary | ICD-10-CM | POA: Diagnosis not present

## 2022-04-19 DIAGNOSIS — H353221 Exudative age-related macular degeneration, left eye, with active choroidal neovascularization: Secondary | ICD-10-CM | POA: Diagnosis not present

## 2022-04-19 DIAGNOSIS — H35033 Hypertensive retinopathy, bilateral: Secondary | ICD-10-CM | POA: Diagnosis not present

## 2022-05-10 DIAGNOSIS — E785 Hyperlipidemia, unspecified: Secondary | ICD-10-CM | POA: Diagnosis not present

## 2022-05-10 DIAGNOSIS — R7989 Other specified abnormal findings of blood chemistry: Secondary | ICD-10-CM | POA: Diagnosis not present

## 2022-05-10 DIAGNOSIS — R7301 Impaired fasting glucose: Secondary | ICD-10-CM | POA: Diagnosis not present

## 2022-05-10 DIAGNOSIS — I10 Essential (primary) hypertension: Secondary | ICD-10-CM | POA: Diagnosis not present

## 2022-05-11 DIAGNOSIS — Z0000 Encounter for general adult medical examination without abnormal findings: Secondary | ICD-10-CM | POA: Diagnosis not present

## 2022-05-17 DIAGNOSIS — R7301 Impaired fasting glucose: Secondary | ICD-10-CM | POA: Diagnosis not present

## 2022-05-17 DIAGNOSIS — Z0000 Encounter for general adult medical examination without abnormal findings: Secondary | ICD-10-CM | POA: Diagnosis not present

## 2022-05-17 DIAGNOSIS — G4700 Insomnia, unspecified: Secondary | ICD-10-CM | POA: Diagnosis not present

## 2022-05-17 DIAGNOSIS — G8929 Other chronic pain: Secondary | ICD-10-CM | POA: Diagnosis not present

## 2022-05-17 DIAGNOSIS — L989 Disorder of the skin and subcutaneous tissue, unspecified: Secondary | ICD-10-CM | POA: Diagnosis not present

## 2022-05-17 DIAGNOSIS — H8109 Meniere's disease, unspecified ear: Secondary | ICD-10-CM | POA: Diagnosis not present

## 2022-05-17 DIAGNOSIS — K219 Gastro-esophageal reflux disease without esophagitis: Secondary | ICD-10-CM | POA: Diagnosis not present

## 2022-05-17 DIAGNOSIS — M545 Low back pain, unspecified: Secondary | ICD-10-CM | POA: Diagnosis not present

## 2022-05-17 DIAGNOSIS — E785 Hyperlipidemia, unspecified: Secondary | ICD-10-CM | POA: Diagnosis not present

## 2022-05-17 DIAGNOSIS — I10 Essential (primary) hypertension: Secondary | ICD-10-CM | POA: Diagnosis not present

## 2022-05-27 ENCOUNTER — Encounter (INDEPENDENT_AMBULATORY_CARE_PROVIDER_SITE_OTHER): Payer: Medicare Other | Admitting: Ophthalmology

## 2022-05-27 DIAGNOSIS — H35033 Hypertensive retinopathy, bilateral: Secondary | ICD-10-CM | POA: Diagnosis not present

## 2022-05-27 DIAGNOSIS — H43813 Vitreous degeneration, bilateral: Secondary | ICD-10-CM

## 2022-05-27 DIAGNOSIS — H353111 Nonexudative age-related macular degeneration, right eye, early dry stage: Secondary | ICD-10-CM | POA: Diagnosis not present

## 2022-05-27 DIAGNOSIS — H353221 Exudative age-related macular degeneration, left eye, with active choroidal neovascularization: Secondary | ICD-10-CM | POA: Diagnosis not present

## 2022-05-27 DIAGNOSIS — I10 Essential (primary) hypertension: Secondary | ICD-10-CM | POA: Diagnosis not present

## 2022-06-07 ENCOUNTER — Encounter (INDEPENDENT_AMBULATORY_CARE_PROVIDER_SITE_OTHER): Payer: Medicare Other | Admitting: Ophthalmology

## 2022-06-10 DIAGNOSIS — I10 Essential (primary) hypertension: Secondary | ICD-10-CM | POA: Diagnosis not present

## 2022-06-11 DIAGNOSIS — I10 Essential (primary) hypertension: Secondary | ICD-10-CM | POA: Diagnosis not present

## 2022-07-06 DIAGNOSIS — Z23 Encounter for immunization: Secondary | ICD-10-CM | POA: Diagnosis not present

## 2022-07-12 ENCOUNTER — Encounter (INDEPENDENT_AMBULATORY_CARE_PROVIDER_SITE_OTHER): Payer: Medicare Other | Admitting: Ophthalmology

## 2022-07-12 DIAGNOSIS — H353112 Nonexudative age-related macular degeneration, right eye, intermediate dry stage: Secondary | ICD-10-CM

## 2022-07-12 DIAGNOSIS — I10 Essential (primary) hypertension: Secondary | ICD-10-CM | POA: Diagnosis not present

## 2022-07-12 DIAGNOSIS — H43813 Vitreous degeneration, bilateral: Secondary | ICD-10-CM

## 2022-07-12 DIAGNOSIS — H35033 Hypertensive retinopathy, bilateral: Secondary | ICD-10-CM | POA: Diagnosis not present

## 2022-07-12 DIAGNOSIS — H353221 Exudative age-related macular degeneration, left eye, with active choroidal neovascularization: Secondary | ICD-10-CM

## 2022-07-31 DIAGNOSIS — Z23 Encounter for immunization: Secondary | ICD-10-CM | POA: Diagnosis not present

## 2022-08-30 ENCOUNTER — Encounter (INDEPENDENT_AMBULATORY_CARE_PROVIDER_SITE_OTHER): Payer: Medicare Other | Admitting: Ophthalmology

## 2022-08-30 DIAGNOSIS — H43813 Vitreous degeneration, bilateral: Secondary | ICD-10-CM | POA: Diagnosis not present

## 2022-08-30 DIAGNOSIS — H35033 Hypertensive retinopathy, bilateral: Secondary | ICD-10-CM

## 2022-08-30 DIAGNOSIS — H353221 Exudative age-related macular degeneration, left eye, with active choroidal neovascularization: Secondary | ICD-10-CM

## 2022-08-30 DIAGNOSIS — H353112 Nonexudative age-related macular degeneration, right eye, intermediate dry stage: Secondary | ICD-10-CM

## 2022-08-30 DIAGNOSIS — I10 Essential (primary) hypertension: Secondary | ICD-10-CM | POA: Diagnosis not present

## 2022-10-19 ENCOUNTER — Encounter (INDEPENDENT_AMBULATORY_CARE_PROVIDER_SITE_OTHER): Payer: Medicare Other | Admitting: Ophthalmology

## 2022-10-19 DIAGNOSIS — H35033 Hypertensive retinopathy, bilateral: Secondary | ICD-10-CM

## 2022-10-19 DIAGNOSIS — H353112 Nonexudative age-related macular degeneration, right eye, intermediate dry stage: Secondary | ICD-10-CM | POA: Diagnosis not present

## 2022-10-19 DIAGNOSIS — H353221 Exudative age-related macular degeneration, left eye, with active choroidal neovascularization: Secondary | ICD-10-CM

## 2022-10-19 DIAGNOSIS — H43813 Vitreous degeneration, bilateral: Secondary | ICD-10-CM

## 2022-10-19 DIAGNOSIS — I10 Essential (primary) hypertension: Secondary | ICD-10-CM | POA: Diagnosis not present

## 2022-10-25 DIAGNOSIS — E785 Hyperlipidemia, unspecified: Secondary | ICD-10-CM | POA: Diagnosis not present

## 2022-10-25 DIAGNOSIS — G4700 Insomnia, unspecified: Secondary | ICD-10-CM | POA: Diagnosis not present

## 2022-10-25 DIAGNOSIS — R7301 Impaired fasting glucose: Secondary | ICD-10-CM | POA: Diagnosis not present

## 2022-10-25 DIAGNOSIS — I10 Essential (primary) hypertension: Secondary | ICD-10-CM | POA: Diagnosis not present

## 2022-11-22 DIAGNOSIS — H524 Presbyopia: Secondary | ICD-10-CM | POA: Diagnosis not present

## 2022-11-22 DIAGNOSIS — H02831 Dermatochalasis of right upper eyelid: Secondary | ICD-10-CM | POA: Diagnosis not present

## 2022-11-22 DIAGNOSIS — H02834 Dermatochalasis of left upper eyelid: Secondary | ICD-10-CM | POA: Diagnosis not present

## 2022-11-22 DIAGNOSIS — H353221 Exudative age-related macular degeneration, left eye, with active choroidal neovascularization: Secondary | ICD-10-CM | POA: Diagnosis not present

## 2022-11-22 DIAGNOSIS — H5203 Hypermetropia, bilateral: Secondary | ICD-10-CM | POA: Diagnosis not present

## 2022-11-22 DIAGNOSIS — H25813 Combined forms of age-related cataract, bilateral: Secondary | ICD-10-CM | POA: Diagnosis not present

## 2022-11-22 DIAGNOSIS — H43813 Vitreous degeneration, bilateral: Secondary | ICD-10-CM | POA: Diagnosis not present

## 2022-12-06 ENCOUNTER — Encounter (INDEPENDENT_AMBULATORY_CARE_PROVIDER_SITE_OTHER): Payer: Medicare Other | Admitting: Ophthalmology

## 2022-12-06 DIAGNOSIS — H35033 Hypertensive retinopathy, bilateral: Secondary | ICD-10-CM | POA: Diagnosis not present

## 2022-12-06 DIAGNOSIS — H353221 Exudative age-related macular degeneration, left eye, with active choroidal neovascularization: Secondary | ICD-10-CM

## 2022-12-06 DIAGNOSIS — H353112 Nonexudative age-related macular degeneration, right eye, intermediate dry stage: Secondary | ICD-10-CM

## 2022-12-06 DIAGNOSIS — I10 Essential (primary) hypertension: Secondary | ICD-10-CM | POA: Diagnosis not present

## 2022-12-06 DIAGNOSIS — H43813 Vitreous degeneration, bilateral: Secondary | ICD-10-CM | POA: Diagnosis not present

## 2023-01-08 IMAGING — CT CT HEAD W/O CM
4 series · 16 of 47 positions shown, 18 images · non-contrast
Comparison: None Available.

CLINICAL DATA: Fall from standing position.  Hit head on fireplace.



[Series 2: head wo · axial · 0.41mm/px · z∈[-73,+37]mm · 7 of 30 slices shown, 9 images]
[im 4/30  brain]
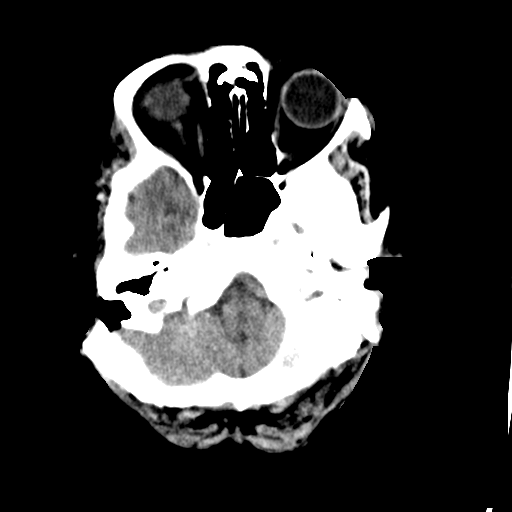
[im 4/30  bone]
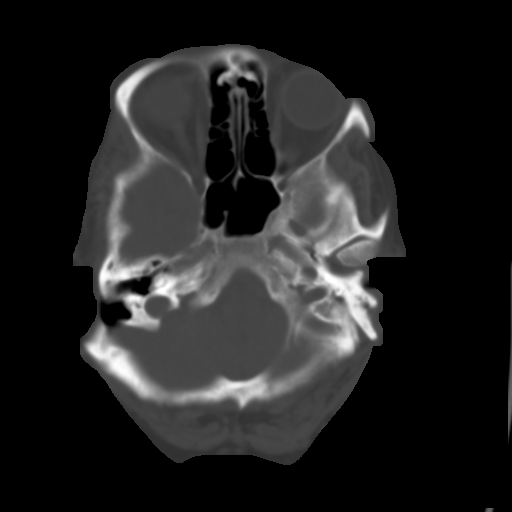
[im 8/30  brain]
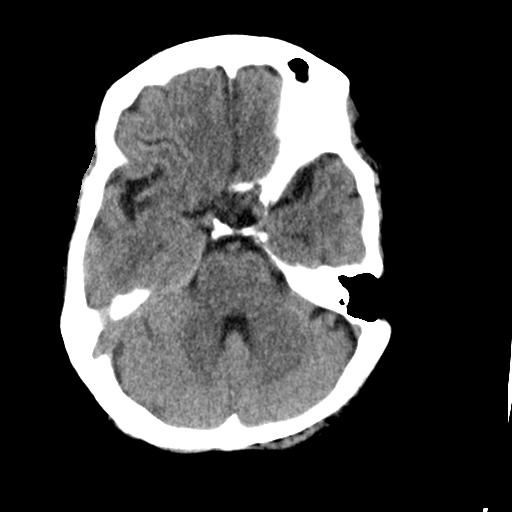
[im 11/30  brain]
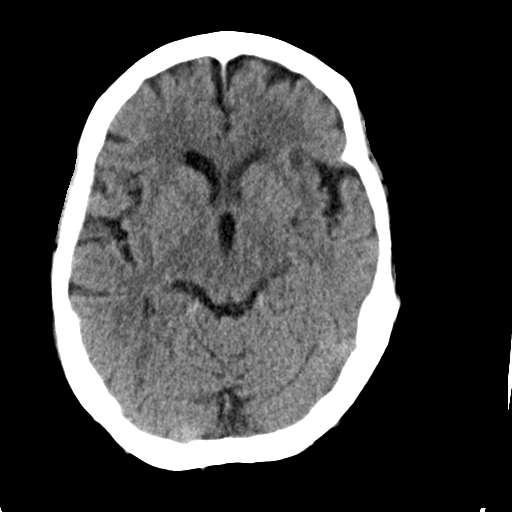
[im 15/30  brain]
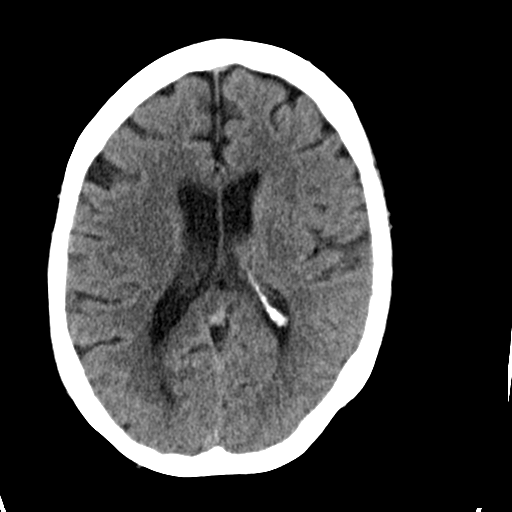
[im 19/30  brain]
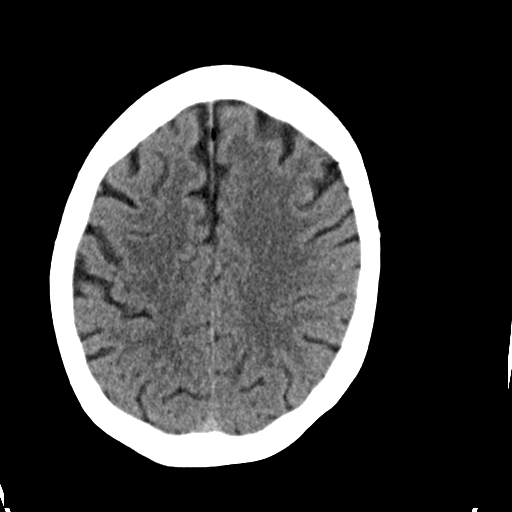
[im 19/30  bone]
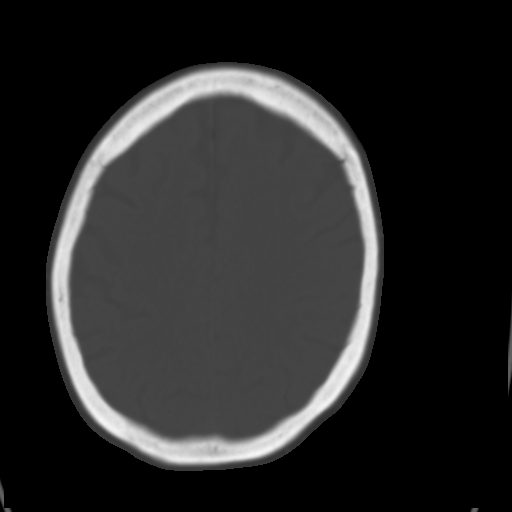
[im 22/30  brain]
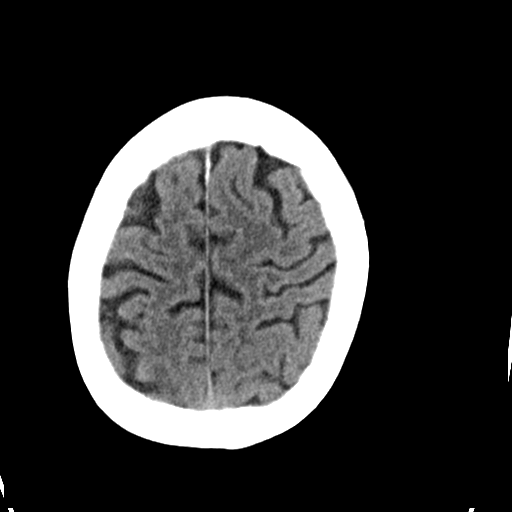
[im 26/30  brain]
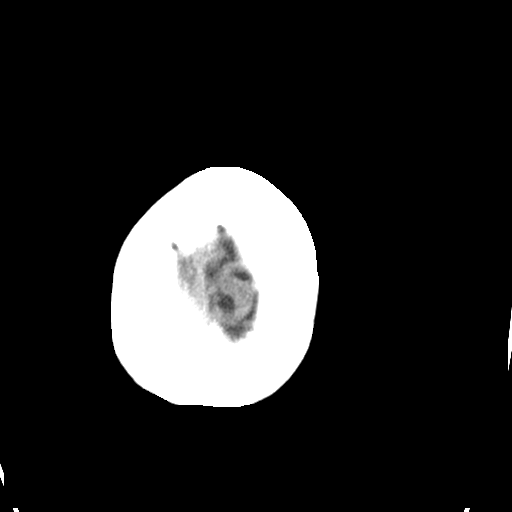

[Series 3: head bone · axial · 0.41mm/px · z∈[-74,-46]mm · 3 of 74 slices shown]
[im 8/74  bone]
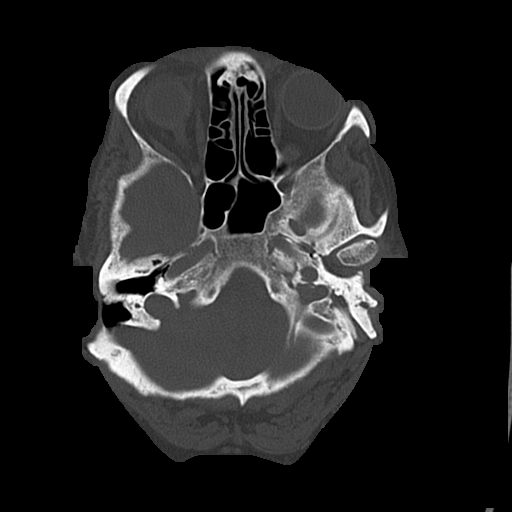
[im 15/74  bone]
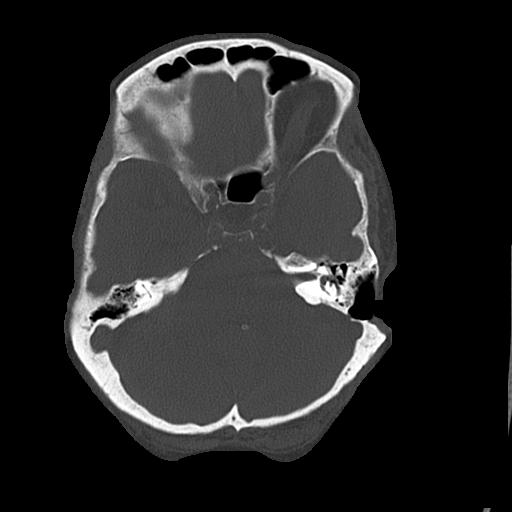
[im 22/74  bone]
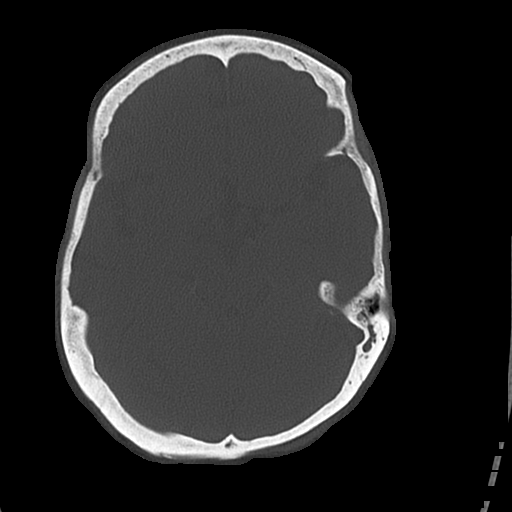

[Series 4: coronal soft · coronal · 0.31mm/px · 3 of 64 slices shown]
[im 22/64  brain]
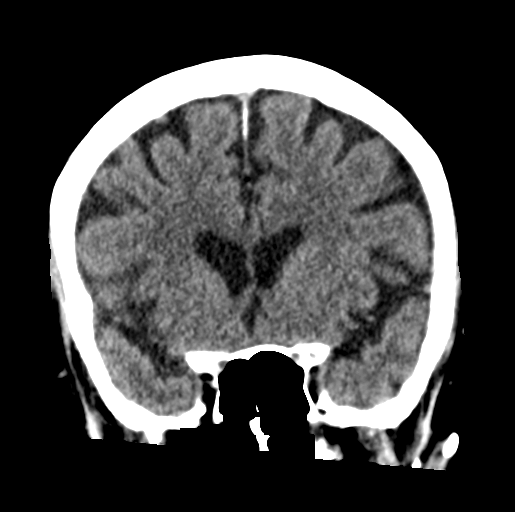
[im 29/64  brain]
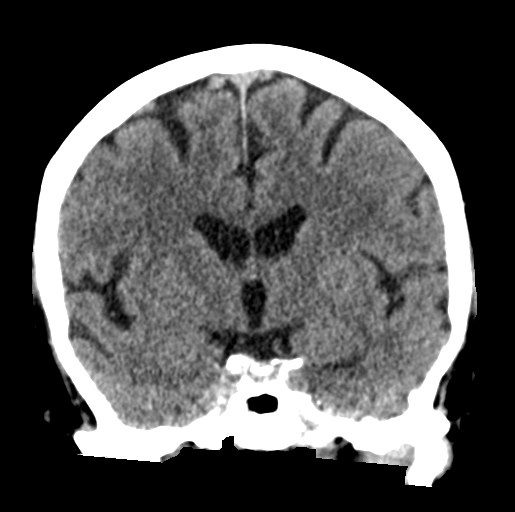
[im 36/64  brain]
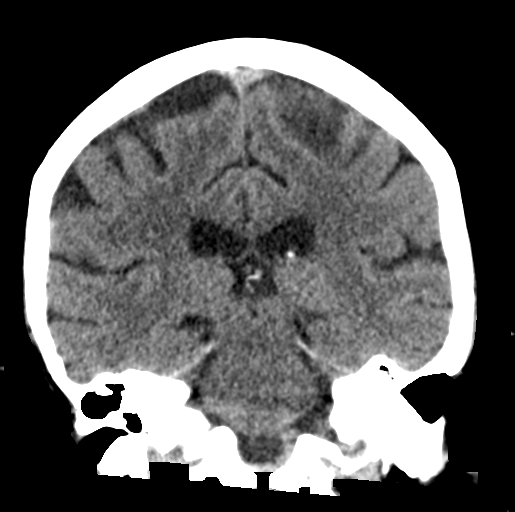

[Series 5: sagittal soft · sagittal · 0.31mm/px · 3 of 55 slices shown]
[im 19/55  brain]
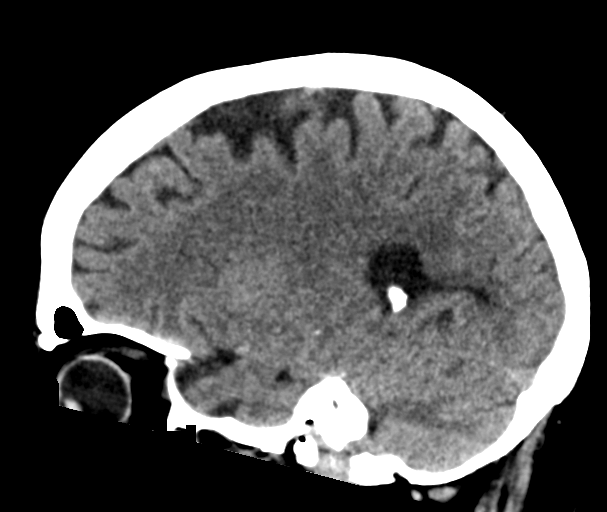
[im 28/55  brain]
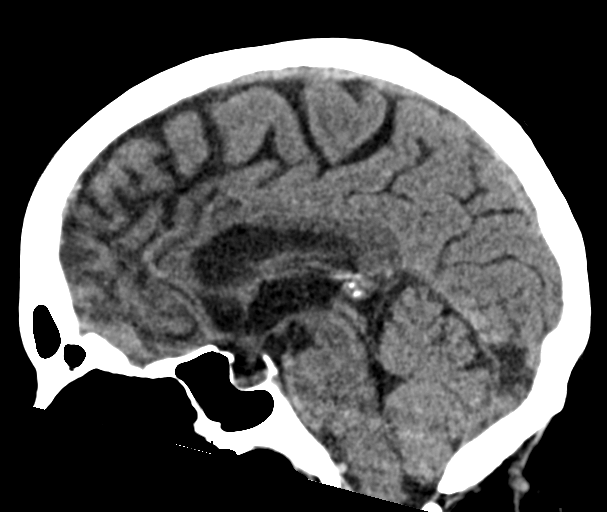
[im 37/55  brain]
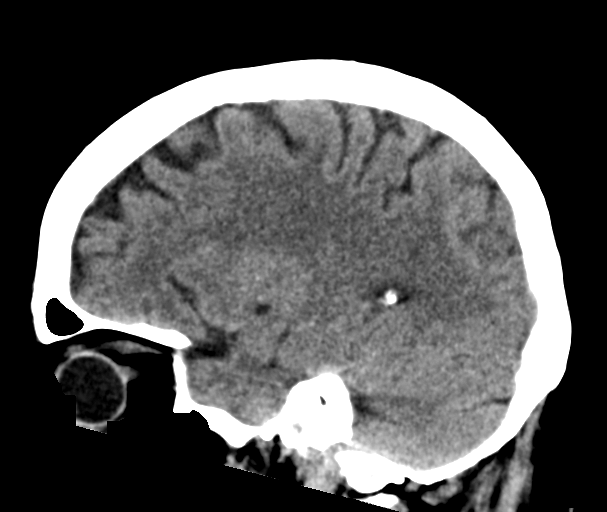

[16 of 47 positions shown; findings below may reference images not displayed]

FINDINGS: Brain: No acute infarct, hemorrhage, or mass lesion is present. Mild
periventricular white matter hypoattenuation is present. Basal
ganglia are intact. Insular ribbon is normal bilaterally. No acute
or focal cortical abnormality is present.

No significant extraaxial fluid collection is present. The
ventricles are of normal size.

Vascular: Atherosclerotic calcifications are present within the
cavernous internal carotid arteries bilaterally. No hyperdense
vessel is present.

Skull: Calvarium is intact. No focal lytic or blastic lesions are
present. No significant extracranial soft tissue lesion is present.

Sinuses/Orbits: Bilateral mastoidectomies noted, wall up on the left
and wall down on the right. Fluid remains scratched at fluid is
present in residual left mastoid air cells. The paranasal sinuses
are clear. The globes and orbits are within normal limits.
IMPRESSION: 1. No acute intracranial abnormality.
2. Mild periventricular white matter disease likely reflects the
sequela of chronic microvascular ischemia.
3. Bilateral mastoidectomies.

## 2023-01-08 IMAGING — CT CT CERVICAL SPINE W/O CM
3 of 4 series · 13 of 33 positions shown, 16 images · non-contrast
Comparison: None Available.

CLINICAL DATA: Neck trauma (Age >= 65y) patient fell while bending
over to pet her dog hitting side of the head on fire place. No loss
of consciousness.



[Series 5: cor bone · coronal · 0.32mm/px · 3 of 61 slices shown]
[im 13/61  bone]
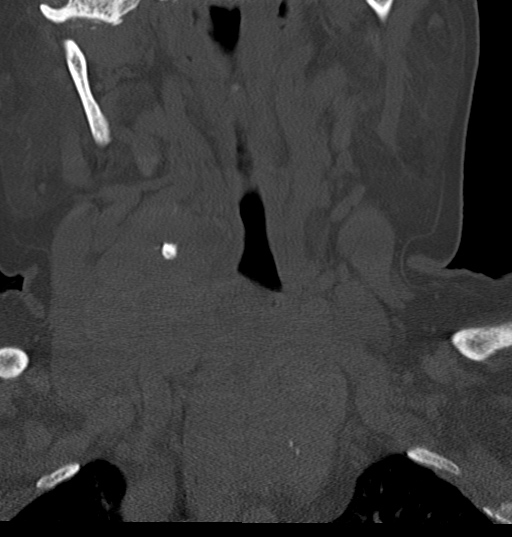
[im 25/61  bone]
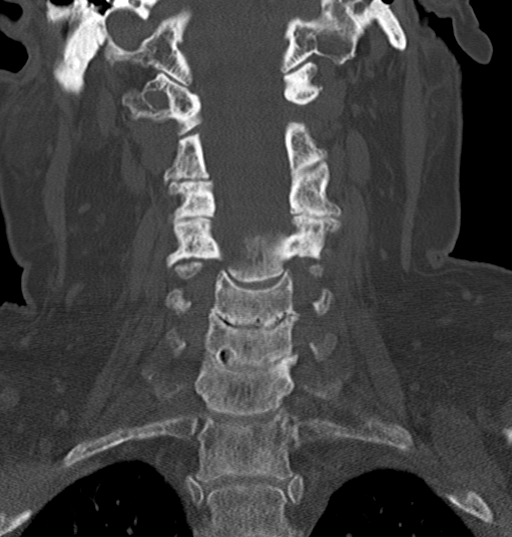
[im 37/61  bone]
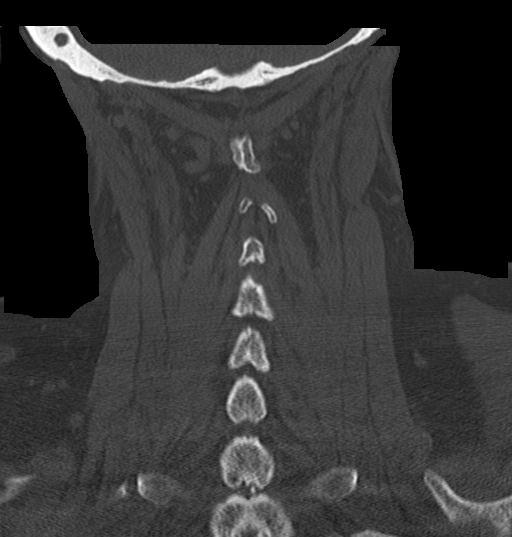

[Series 6: sag bone · sagittal · 0.24mm/px · 5 of 82 slices shown, 6 images]
[im 28/82  bone]
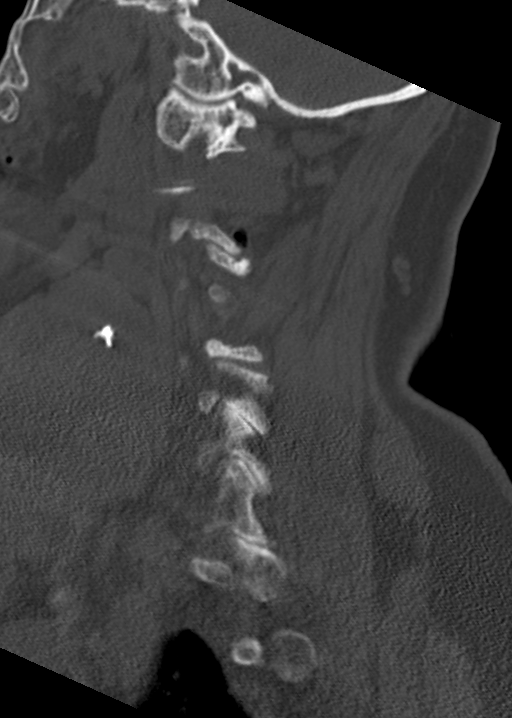
[im 34/82  bone]
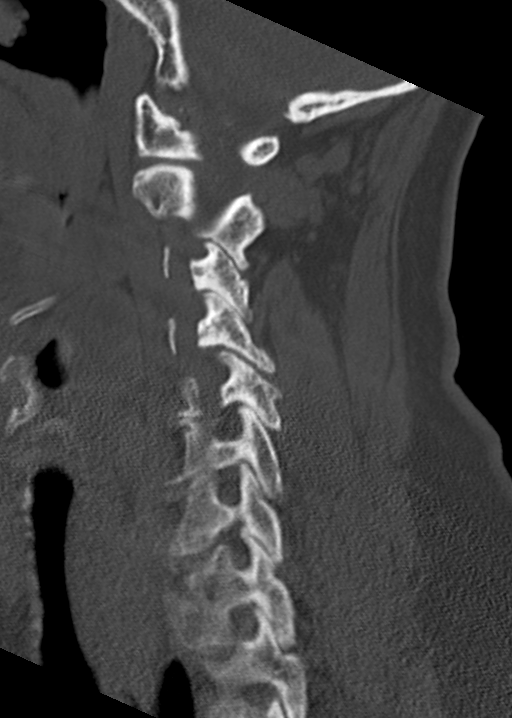
[im 41/82  soft-tissue]
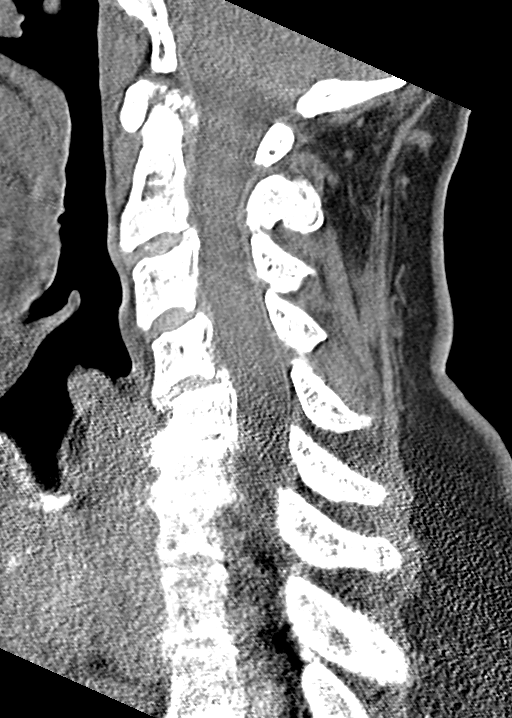
[im 41/82  bone]
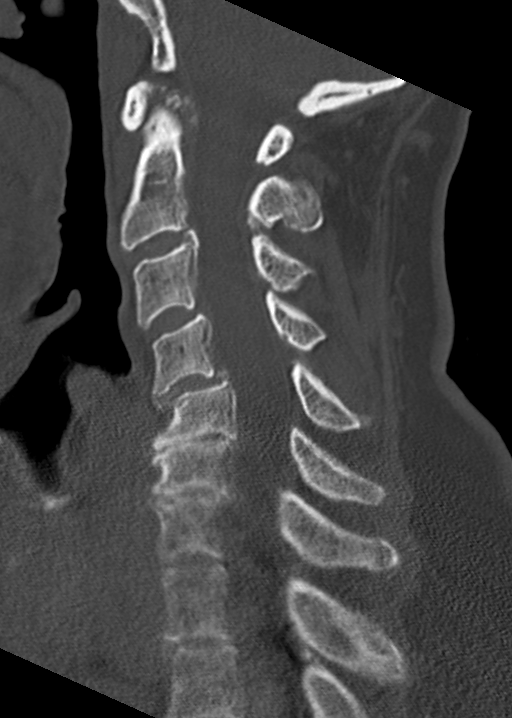
[im 48/82  bone]
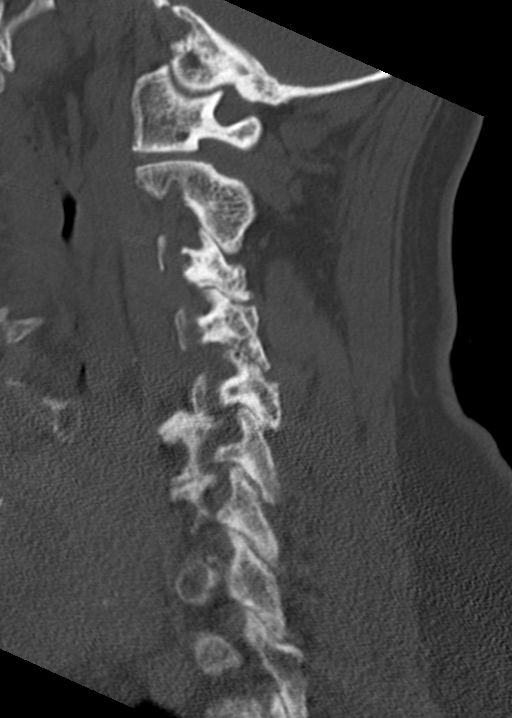
[im 55/82  bone]
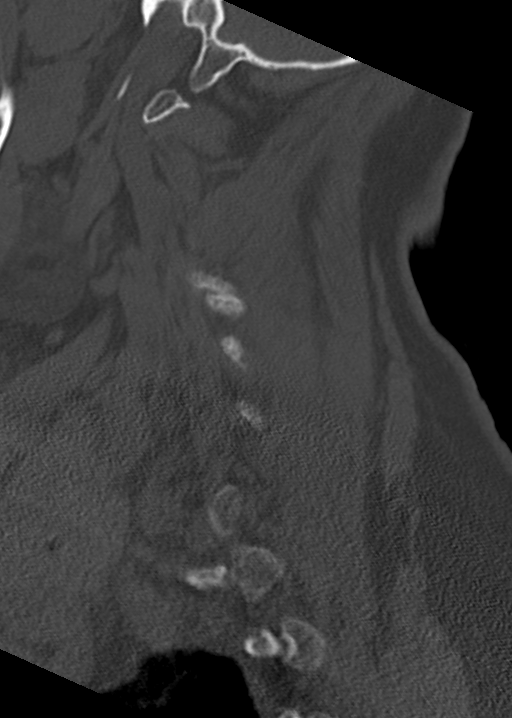

[Series 7: orthogonal axials · axial · 0.22mm/px · z∈[-235,-130]mm · 5 of 88 slices shown, 7 images]
[im 15/88  soft-tissue]
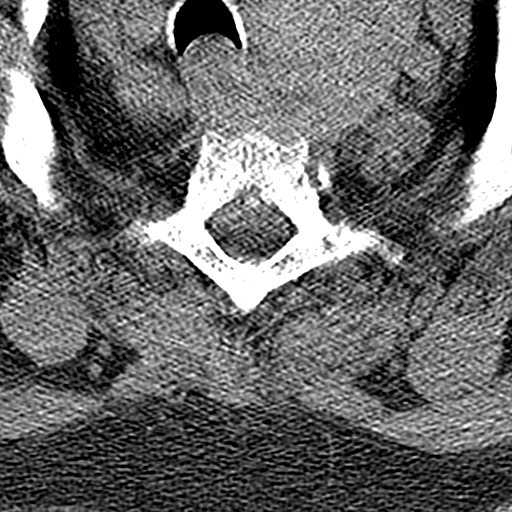
[im 15/88  bone]
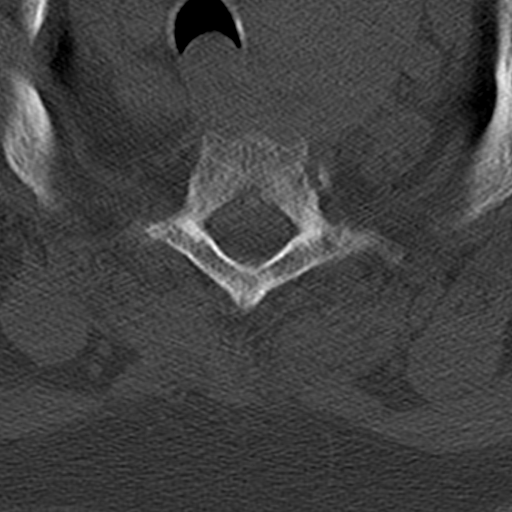
[im 30/88  bone]
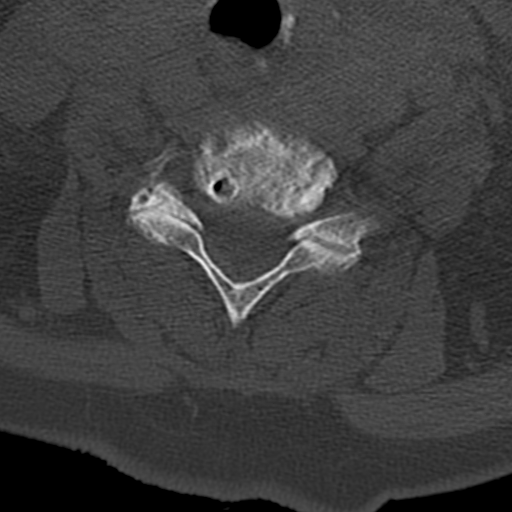
[im 44/88  bone]
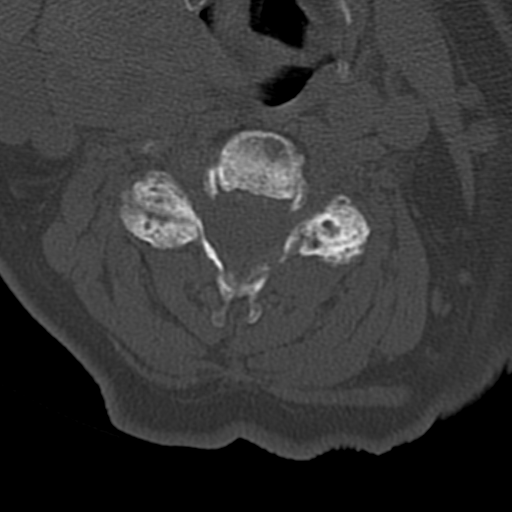
[im 59/88  bone]
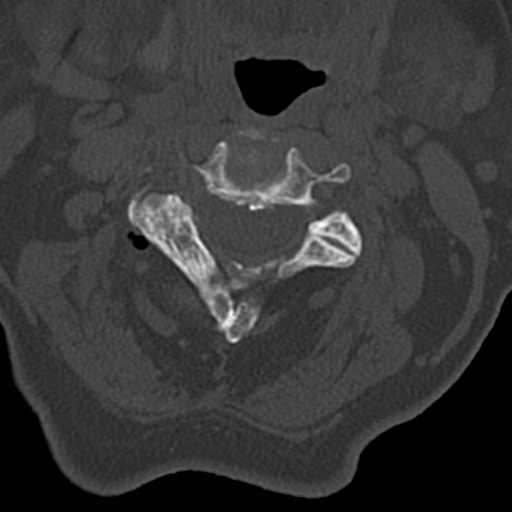
[im 73/88  soft-tissue]
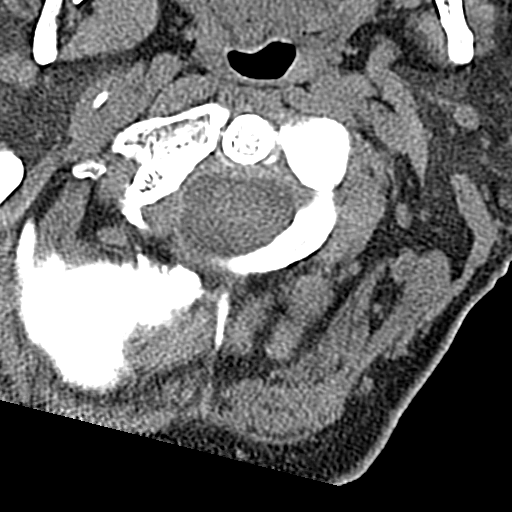
[im 73/88  bone]
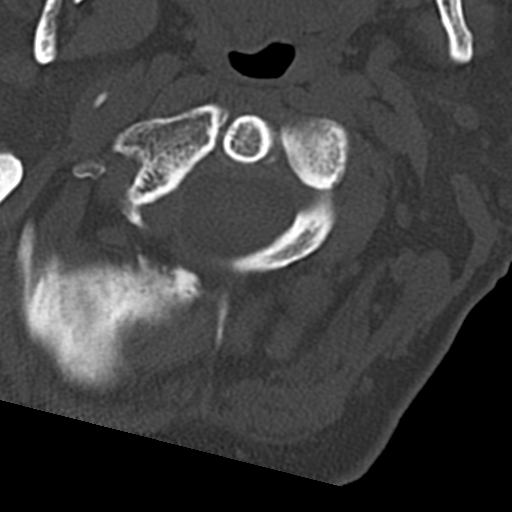

[13 of 33 positions shown; findings below may reference images not displayed]

FINDINGS: Alignment: There is 3 mm anterolisthesis at C4-C5.

Skull base and vertebrae: No fracture or dislocation. Moderately
severe cervical spondylosis with discogenic and facet hypertrophic
changes seen more prominent at C5-C6 and C6-C7. At C5-C6 and C6-C7,
there is loss of the disc space and prominent marginal osteophytes
seen. Posterior osteophytic ridge and uncovertebral hypertrophic
changes cause mild-to-moderate narrowing of the left neural foramina
at C5-C6 and C6-C7.

Soft tissues and spinal canal: No prevertebral fluid or swelling. No
visible canal hematoma.

Disc levels:  Loss of the disc space at C5-C6 and C6-C7.

Upper chest: Negative.

Other: None.
IMPRESSION: No fracture or dislocation.

Moderately severe cervical spondylosis with facet hypertrophy and
discogenic degenerative changes more prominent at C5-C6 and C6-C7
with prominent marginal osteophytes and loss of the disc space.
Grade 1 anterolisthesis at C4-C5.

## 2023-01-17 ENCOUNTER — Encounter (INDEPENDENT_AMBULATORY_CARE_PROVIDER_SITE_OTHER): Payer: Medicare Other | Admitting: Ophthalmology

## 2023-01-17 DIAGNOSIS — H353112 Nonexudative age-related macular degeneration, right eye, intermediate dry stage: Secondary | ICD-10-CM

## 2023-01-17 DIAGNOSIS — H35033 Hypertensive retinopathy, bilateral: Secondary | ICD-10-CM

## 2023-01-17 DIAGNOSIS — H43813 Vitreous degeneration, bilateral: Secondary | ICD-10-CM | POA: Diagnosis not present

## 2023-01-17 DIAGNOSIS — H353221 Exudative age-related macular degeneration, left eye, with active choroidal neovascularization: Secondary | ICD-10-CM

## 2023-01-17 DIAGNOSIS — I10 Essential (primary) hypertension: Secondary | ICD-10-CM | POA: Diagnosis not present

## 2023-03-07 ENCOUNTER — Encounter (INDEPENDENT_AMBULATORY_CARE_PROVIDER_SITE_OTHER): Payer: Medicare Other | Admitting: Ophthalmology

## 2023-03-07 DIAGNOSIS — I10 Essential (primary) hypertension: Secondary | ICD-10-CM | POA: Diagnosis not present

## 2023-03-07 DIAGNOSIS — H353221 Exudative age-related macular degeneration, left eye, with active choroidal neovascularization: Secondary | ICD-10-CM

## 2023-03-07 DIAGNOSIS — H35033 Hypertensive retinopathy, bilateral: Secondary | ICD-10-CM | POA: Diagnosis not present

## 2023-03-07 DIAGNOSIS — H43813 Vitreous degeneration, bilateral: Secondary | ICD-10-CM | POA: Diagnosis not present

## 2023-03-07 DIAGNOSIS — H353112 Nonexudative age-related macular degeneration, right eye, intermediate dry stage: Secondary | ICD-10-CM | POA: Diagnosis not present

## 2023-04-25 ENCOUNTER — Encounter (INDEPENDENT_AMBULATORY_CARE_PROVIDER_SITE_OTHER): Payer: Medicare Other | Admitting: Ophthalmology

## 2023-04-25 DIAGNOSIS — H353112 Nonexudative age-related macular degeneration, right eye, intermediate dry stage: Secondary | ICD-10-CM | POA: Diagnosis not present

## 2023-04-25 DIAGNOSIS — H353221 Exudative age-related macular degeneration, left eye, with active choroidal neovascularization: Secondary | ICD-10-CM

## 2023-04-25 DIAGNOSIS — H43813 Vitreous degeneration, bilateral: Secondary | ICD-10-CM | POA: Diagnosis not present

## 2023-04-25 DIAGNOSIS — H35033 Hypertensive retinopathy, bilateral: Secondary | ICD-10-CM | POA: Diagnosis not present

## 2023-04-25 DIAGNOSIS — I10 Essential (primary) hypertension: Secondary | ICD-10-CM

## 2023-05-12 DIAGNOSIS — H2513 Age-related nuclear cataract, bilateral: Secondary | ICD-10-CM | POA: Diagnosis not present

## 2023-05-12 DIAGNOSIS — H52203 Unspecified astigmatism, bilateral: Secondary | ICD-10-CM | POA: Diagnosis not present

## 2023-06-13 ENCOUNTER — Encounter (INDEPENDENT_AMBULATORY_CARE_PROVIDER_SITE_OTHER): Payer: Medicare Other | Admitting: Ophthalmology

## 2023-06-13 DIAGNOSIS — H353112 Nonexudative age-related macular degeneration, right eye, intermediate dry stage: Secondary | ICD-10-CM

## 2023-06-13 DIAGNOSIS — H35033 Hypertensive retinopathy, bilateral: Secondary | ICD-10-CM | POA: Diagnosis not present

## 2023-06-13 DIAGNOSIS — H2513 Age-related nuclear cataract, bilateral: Secondary | ICD-10-CM | POA: Diagnosis not present

## 2023-06-13 DIAGNOSIS — H353221 Exudative age-related macular degeneration, left eye, with active choroidal neovascularization: Secondary | ICD-10-CM | POA: Diagnosis not present

## 2023-06-13 DIAGNOSIS — I10 Essential (primary) hypertension: Secondary | ICD-10-CM

## 2023-06-13 DIAGNOSIS — H43813 Vitreous degeneration, bilateral: Secondary | ICD-10-CM

## 2023-07-04 DIAGNOSIS — H25811 Combined forms of age-related cataract, right eye: Secondary | ICD-10-CM | POA: Diagnosis not present

## 2023-07-04 DIAGNOSIS — Z961 Presence of intraocular lens: Secondary | ICD-10-CM | POA: Diagnosis not present

## 2023-07-04 DIAGNOSIS — H2511 Age-related nuclear cataract, right eye: Secondary | ICD-10-CM | POA: Diagnosis not present

## 2023-07-18 ENCOUNTER — Encounter (INDEPENDENT_AMBULATORY_CARE_PROVIDER_SITE_OTHER): Payer: Medicare Other | Admitting: Ophthalmology

## 2023-07-18 DIAGNOSIS — H35033 Hypertensive retinopathy, bilateral: Secondary | ICD-10-CM

## 2023-07-18 DIAGNOSIS — I10 Essential (primary) hypertension: Secondary | ICD-10-CM | POA: Diagnosis not present

## 2023-07-18 DIAGNOSIS — H353112 Nonexudative age-related macular degeneration, right eye, intermediate dry stage: Secondary | ICD-10-CM | POA: Diagnosis not present

## 2023-07-18 DIAGNOSIS — H353221 Exudative age-related macular degeneration, left eye, with active choroidal neovascularization: Secondary | ICD-10-CM | POA: Diagnosis not present

## 2023-07-18 DIAGNOSIS — H43813 Vitreous degeneration, bilateral: Secondary | ICD-10-CM

## 2023-07-19 DIAGNOSIS — I10 Essential (primary) hypertension: Secondary | ICD-10-CM | POA: Diagnosis not present

## 2023-07-19 DIAGNOSIS — E785 Hyperlipidemia, unspecified: Secondary | ICD-10-CM | POA: Diagnosis not present

## 2023-07-19 DIAGNOSIS — R7301 Impaired fasting glucose: Secondary | ICD-10-CM | POA: Diagnosis not present

## 2023-07-19 DIAGNOSIS — Z1212 Encounter for screening for malignant neoplasm of rectum: Secondary | ICD-10-CM | POA: Diagnosis not present

## 2023-07-26 DIAGNOSIS — M1990 Unspecified osteoarthritis, unspecified site: Secondary | ICD-10-CM | POA: Diagnosis not present

## 2023-07-26 DIAGNOSIS — I10 Essential (primary) hypertension: Secondary | ICD-10-CM | POA: Diagnosis not present

## 2023-07-26 DIAGNOSIS — E1151 Type 2 diabetes mellitus with diabetic peripheral angiopathy without gangrene: Secondary | ICD-10-CM | POA: Diagnosis not present

## 2023-07-26 DIAGNOSIS — I739 Peripheral vascular disease, unspecified: Secondary | ICD-10-CM | POA: Diagnosis not present

## 2023-07-26 DIAGNOSIS — E785 Hyperlipidemia, unspecified: Secondary | ICD-10-CM | POA: Diagnosis not present

## 2023-07-26 DIAGNOSIS — Z23 Encounter for immunization: Secondary | ICD-10-CM | POA: Diagnosis not present

## 2023-07-26 DIAGNOSIS — G4700 Insomnia, unspecified: Secondary | ICD-10-CM | POA: Diagnosis not present

## 2023-07-26 DIAGNOSIS — Z0000 Encounter for general adult medical examination without abnormal findings: Secondary | ICD-10-CM | POA: Diagnosis not present

## 2023-08-22 ENCOUNTER — Encounter (INDEPENDENT_AMBULATORY_CARE_PROVIDER_SITE_OTHER): Payer: Medicare Other | Admitting: Ophthalmology

## 2023-08-22 DIAGNOSIS — H353112 Nonexudative age-related macular degeneration, right eye, intermediate dry stage: Secondary | ICD-10-CM | POA: Diagnosis not present

## 2023-08-22 DIAGNOSIS — H2512 Age-related nuclear cataract, left eye: Secondary | ICD-10-CM

## 2023-08-22 DIAGNOSIS — H35033 Hypertensive retinopathy, bilateral: Secondary | ICD-10-CM | POA: Diagnosis not present

## 2023-08-22 DIAGNOSIS — H43813 Vitreous degeneration, bilateral: Secondary | ICD-10-CM

## 2023-08-22 DIAGNOSIS — I10 Essential (primary) hypertension: Secondary | ICD-10-CM

## 2023-08-22 DIAGNOSIS — H353221 Exudative age-related macular degeneration, left eye, with active choroidal neovascularization: Secondary | ICD-10-CM

## 2023-08-29 DIAGNOSIS — Z23 Encounter for immunization: Secondary | ICD-10-CM | POA: Diagnosis not present

## 2023-09-27 ENCOUNTER — Encounter (INDEPENDENT_AMBULATORY_CARE_PROVIDER_SITE_OTHER): Payer: Medicare Other | Admitting: Ophthalmology

## 2023-09-27 DIAGNOSIS — H353112 Nonexudative age-related macular degeneration, right eye, intermediate dry stage: Secondary | ICD-10-CM | POA: Diagnosis not present

## 2023-09-27 DIAGNOSIS — H2512 Age-related nuclear cataract, left eye: Secondary | ICD-10-CM

## 2023-09-27 DIAGNOSIS — I10 Essential (primary) hypertension: Secondary | ICD-10-CM | POA: Diagnosis not present

## 2023-09-27 DIAGNOSIS — H35033 Hypertensive retinopathy, bilateral: Secondary | ICD-10-CM | POA: Diagnosis not present

## 2023-09-27 DIAGNOSIS — H43813 Vitreous degeneration, bilateral: Secondary | ICD-10-CM | POA: Diagnosis not present

## 2023-09-27 DIAGNOSIS — H353221 Exudative age-related macular degeneration, left eye, with active choroidal neovascularization: Secondary | ICD-10-CM | POA: Diagnosis not present

## 2023-10-31 ENCOUNTER — Encounter (INDEPENDENT_AMBULATORY_CARE_PROVIDER_SITE_OTHER): Payer: Medicare Other | Admitting: Ophthalmology

## 2023-10-31 DIAGNOSIS — I10 Essential (primary) hypertension: Secondary | ICD-10-CM | POA: Diagnosis not present

## 2023-10-31 DIAGNOSIS — H43813 Vitreous degeneration, bilateral: Secondary | ICD-10-CM | POA: Diagnosis not present

## 2023-10-31 DIAGNOSIS — H35033 Hypertensive retinopathy, bilateral: Secondary | ICD-10-CM | POA: Diagnosis not present

## 2023-10-31 DIAGNOSIS — H353221 Exudative age-related macular degeneration, left eye, with active choroidal neovascularization: Secondary | ICD-10-CM | POA: Diagnosis not present

## 2023-10-31 DIAGNOSIS — H353112 Nonexudative age-related macular degeneration, right eye, intermediate dry stage: Secondary | ICD-10-CM

## 2023-12-05 ENCOUNTER — Encounter (INDEPENDENT_AMBULATORY_CARE_PROVIDER_SITE_OTHER): Payer: Medicare Other | Admitting: Ophthalmology

## 2023-12-05 DIAGNOSIS — H43813 Vitreous degeneration, bilateral: Secondary | ICD-10-CM | POA: Diagnosis not present

## 2023-12-05 DIAGNOSIS — H353221 Exudative age-related macular degeneration, left eye, with active choroidal neovascularization: Secondary | ICD-10-CM | POA: Diagnosis not present

## 2023-12-05 DIAGNOSIS — I10 Essential (primary) hypertension: Secondary | ICD-10-CM

## 2023-12-05 DIAGNOSIS — H353112 Nonexudative age-related macular degeneration, right eye, intermediate dry stage: Secondary | ICD-10-CM

## 2023-12-05 DIAGNOSIS — H35033 Hypertensive retinopathy, bilateral: Secondary | ICD-10-CM

## 2024-01-09 ENCOUNTER — Encounter (INDEPENDENT_AMBULATORY_CARE_PROVIDER_SITE_OTHER): Payer: Medicare Other | Admitting: Ophthalmology

## 2024-01-09 DIAGNOSIS — H43813 Vitreous degeneration, bilateral: Secondary | ICD-10-CM | POA: Diagnosis not present

## 2024-01-09 DIAGNOSIS — I10 Essential (primary) hypertension: Secondary | ICD-10-CM

## 2024-01-09 DIAGNOSIS — H353221 Exudative age-related macular degeneration, left eye, with active choroidal neovascularization: Secondary | ICD-10-CM

## 2024-01-09 DIAGNOSIS — H35033 Hypertensive retinopathy, bilateral: Secondary | ICD-10-CM | POA: Diagnosis not present

## 2024-01-09 DIAGNOSIS — H2512 Age-related nuclear cataract, left eye: Secondary | ICD-10-CM

## 2024-01-09 DIAGNOSIS — H353112 Nonexudative age-related macular degeneration, right eye, intermediate dry stage: Secondary | ICD-10-CM

## 2024-01-24 DIAGNOSIS — E1151 Type 2 diabetes mellitus with diabetic peripheral angiopathy without gangrene: Secondary | ICD-10-CM | POA: Diagnosis not present

## 2024-01-24 DIAGNOSIS — M1990 Unspecified osteoarthritis, unspecified site: Secondary | ICD-10-CM | POA: Diagnosis not present

## 2024-01-24 DIAGNOSIS — I10 Essential (primary) hypertension: Secondary | ICD-10-CM | POA: Diagnosis not present

## 2024-01-24 DIAGNOSIS — H8109 Meniere's disease, unspecified ear: Secondary | ICD-10-CM | POA: Diagnosis not present

## 2024-02-13 ENCOUNTER — Encounter (INDEPENDENT_AMBULATORY_CARE_PROVIDER_SITE_OTHER): Admitting: Ophthalmology

## 2024-02-13 DIAGNOSIS — H35033 Hypertensive retinopathy, bilateral: Secondary | ICD-10-CM | POA: Diagnosis not present

## 2024-02-13 DIAGNOSIS — H353221 Exudative age-related macular degeneration, left eye, with active choroidal neovascularization: Secondary | ICD-10-CM

## 2024-02-13 DIAGNOSIS — H353112 Nonexudative age-related macular degeneration, right eye, intermediate dry stage: Secondary | ICD-10-CM

## 2024-02-13 DIAGNOSIS — I10 Essential (primary) hypertension: Secondary | ICD-10-CM | POA: Diagnosis not present

## 2024-02-13 DIAGNOSIS — H43813 Vitreous degeneration, bilateral: Secondary | ICD-10-CM | POA: Diagnosis not present

## 2024-03-19 ENCOUNTER — Encounter (INDEPENDENT_AMBULATORY_CARE_PROVIDER_SITE_OTHER): Admitting: Ophthalmology

## 2024-03-19 DIAGNOSIS — H2512 Age-related nuclear cataract, left eye: Secondary | ICD-10-CM

## 2024-03-19 DIAGNOSIS — I10 Essential (primary) hypertension: Secondary | ICD-10-CM

## 2024-03-19 DIAGNOSIS — H353221 Exudative age-related macular degeneration, left eye, with active choroidal neovascularization: Secondary | ICD-10-CM | POA: Diagnosis not present

## 2024-03-19 DIAGNOSIS — H43813 Vitreous degeneration, bilateral: Secondary | ICD-10-CM

## 2024-03-19 DIAGNOSIS — H35033 Hypertensive retinopathy, bilateral: Secondary | ICD-10-CM | POA: Diagnosis not present

## 2024-03-19 DIAGNOSIS — H353112 Nonexudative age-related macular degeneration, right eye, intermediate dry stage: Secondary | ICD-10-CM

## 2024-04-02 ENCOUNTER — Ambulatory Visit

## 2024-04-02 ENCOUNTER — Ambulatory Visit (INDEPENDENT_AMBULATORY_CARE_PROVIDER_SITE_OTHER): Admitting: Podiatry

## 2024-04-02 DIAGNOSIS — L600 Ingrowing nail: Secondary | ICD-10-CM

## 2024-04-02 DIAGNOSIS — M7751 Other enthesopathy of right foot: Secondary | ICD-10-CM

## 2024-04-02 NOTE — Progress Notes (Unsigned)
 Subjective:   Patient ID: Marissa Ferguson, female   DOB: 78 y.o.   MRN: [id number]   HPI Chief Complaint  Patient presents with   Ingrown Toenail    RM#11 Right big toe nail infected ingrown treated at home with ointment and foot soaks much better now just wants doctor to look at it.   She butchered the nail   ROS      Objective:  Physical Exam  ***     Assessment:  ***     Plan:  ***

## 2024-04-02 NOTE — Patient Instructions (Signed)
 Ingrown Toenail  An ingrown toenail occurs when the corner or sides of a toenail grow into the surrounding skin. This causes discomfort and pain. The big toe is most commonly affected, but any of the toes can be affected. If an ingrown toenail is not treated, it can become infected. What are the causes? This condition may be caused by: Wearing shoes that are too small or tight. An injury, such as stubbing your toe or having your toe stepped on. Improper cutting or care of your toenails. Having nail or foot abnormalities that were present from birth (congenital abnormalities), such as having a nail that is too big for your toe. What increases the risk? The following factors may make you more likely to develop ingrown toenails: Age. Nails tend to get thicker with age, so ingrown nails are more common among older people. Cutting your toenails incorrectly, such as cutting them very short or cutting them unevenly. An ingrown toenail is more likely to get infected if you have: Diabetes. Blood flow (circulation) problems. What are the signs or symptoms? Symptoms of an ingrown toenail may include: Pain, soreness, or tenderness. Redness. Swelling. Hardening of the skin that surrounds the toenail. Signs that an ingrown toenail may be infected include: Fluid or pus. Symptoms that get worse. How is this diagnosed? Ingrown toenails may be diagnosed based on: Your symptoms and medical history. A physical exam. Labs or tests. If you have fluid or blood coming from your toenail, a sample may be collected to test for the specific type of bacteria that is causing the infection. How is this treated? Treatment depends on the severity of your symptoms. You may be able to care for your toenail at home. If you have an infection, you may be prescribed antibiotic medicines. If you have fluid or pus draining from your toenail, your health care provider may drain it. If you have trouble walking, you may be  given crutches to use. If you have a severe or infected ingrown toenail, you may need a procedure to remove part or all of the nail. Follow these instructions at home: Foot care  Check your wound every day for signs of infection, or as often as told by your health care provider. Check for: More redness, swelling, or pain. More fluid or blood. Warmth. Pus or a bad smell. Do not pick at your toenail or try to remove it yourself. Soak your foot in warm, soapy water. Do this for 20 minutes, 3 times a day, or as often as told by your health care provider. This helps to keep your toe clean and your skin soft. Wear shoes that fit well and are not too tight. Your health care provider may recommend that you wear open-toed shoes while you heal. Trim your toenails regularly and carefully. Cut your toenails straight across to prevent injury to the skin at the corners of the toenail. Do not cut your nails in a curved shape. Keep your feet clean and dry to help prevent infection. General instructions Take over-the-counter and prescription medicines only as told by your health care provider. If you were prescribed an antibiotic, take it as told by your health care provider. Do not stop taking the antibiotic even if you start to feel better. If your health care provider told you to use crutches to help you move around, use them as instructed. Return to your normal activities as told by your health care provider. Ask your health care provider what activities are safe for you.  Keep all follow-up visits. This is important. Contact a health care provider if: You have more redness, swelling, pain, or other symptoms that do not improve with treatment. You have fluid, blood, or pus coming from your toenail. You have a red streak on your skin that starts at your foot and spreads up your leg. You have a fever. Summary An ingrown toenail occurs when the corner or sides of a toenail grow into the surrounding skin.  This causes discomfort and pain. The big toe is most commonly affected, but any of the toes can be affected. If an ingrown toenail is not treated, it can become infected. Fluid or pus draining from your toenail is a sign of infection. Your health care provider may need to drain it. You may be given antibiotics to treat the infection. Trimming your toenails regularly and properly can help you prevent an ingrown toenail. This information is not intended to replace advice given to you by your health care provider. Make sure you discuss any questions you have with your health care provider. Document Revised: 02/03/2021 Document Reviewed: 02/03/2021 [employer] Patient Education  2024 [employer].

## 2024-04-23 ENCOUNTER — Encounter (INDEPENDENT_AMBULATORY_CARE_PROVIDER_SITE_OTHER): Admitting: Ophthalmology

## 2024-04-23 DIAGNOSIS — H2512 Age-related nuclear cataract, left eye: Secondary | ICD-10-CM | POA: Diagnosis not present

## 2024-04-23 DIAGNOSIS — I10 Essential (primary) hypertension: Secondary | ICD-10-CM | POA: Diagnosis not present

## 2024-04-23 DIAGNOSIS — H353221 Exudative age-related macular degeneration, left eye, with active choroidal neovascularization: Secondary | ICD-10-CM

## 2024-04-23 DIAGNOSIS — H43813 Vitreous degeneration, bilateral: Secondary | ICD-10-CM

## 2024-04-23 DIAGNOSIS — H353112 Nonexudative age-related macular degeneration, right eye, intermediate dry stage: Secondary | ICD-10-CM

## 2024-04-23 DIAGNOSIS — H35033 Hypertensive retinopathy, bilateral: Secondary | ICD-10-CM

## 2024-05-28 ENCOUNTER — Encounter (INDEPENDENT_AMBULATORY_CARE_PROVIDER_SITE_OTHER): Admitting: Ophthalmology

## 2024-05-28 DIAGNOSIS — H43813 Vitreous degeneration, bilateral: Secondary | ICD-10-CM | POA: Diagnosis not present

## 2024-05-28 DIAGNOSIS — H35033 Hypertensive retinopathy, bilateral: Secondary | ICD-10-CM

## 2024-05-28 DIAGNOSIS — H353112 Nonexudative age-related macular degeneration, right eye, intermediate dry stage: Secondary | ICD-10-CM

## 2024-05-28 DIAGNOSIS — I10 Essential (primary) hypertension: Secondary | ICD-10-CM

## 2024-05-28 DIAGNOSIS — H353221 Exudative age-related macular degeneration, left eye, with active choroidal neovascularization: Secondary | ICD-10-CM

## 2024-05-28 DIAGNOSIS — H2511 Age-related nuclear cataract, right eye: Secondary | ICD-10-CM

## 2024-07-02 ENCOUNTER — Encounter (INDEPENDENT_AMBULATORY_CARE_PROVIDER_SITE_OTHER): Admitting: Ophthalmology

## 2024-07-02 DIAGNOSIS — I10 Essential (primary) hypertension: Secondary | ICD-10-CM | POA: Diagnosis not present

## 2024-07-02 DIAGNOSIS — H353221 Exudative age-related macular degeneration, left eye, with active choroidal neovascularization: Secondary | ICD-10-CM | POA: Diagnosis not present

## 2024-07-02 DIAGNOSIS — H43813 Vitreous degeneration, bilateral: Secondary | ICD-10-CM

## 2024-07-02 DIAGNOSIS — H35033 Hypertensive retinopathy, bilateral: Secondary | ICD-10-CM

## 2024-07-02 DIAGNOSIS — H353112 Nonexudative age-related macular degeneration, right eye, intermediate dry stage: Secondary | ICD-10-CM

## 2024-08-06 ENCOUNTER — Encounter (INDEPENDENT_AMBULATORY_CARE_PROVIDER_SITE_OTHER): Admitting: Ophthalmology

## 2024-08-06 DIAGNOSIS — I10 Essential (primary) hypertension: Secondary | ICD-10-CM | POA: Diagnosis not present

## 2024-08-06 DIAGNOSIS — H35033 Hypertensive retinopathy, bilateral: Secondary | ICD-10-CM | POA: Diagnosis not present

## 2024-08-06 DIAGNOSIS — H353112 Nonexudative age-related macular degeneration, right eye, intermediate dry stage: Secondary | ICD-10-CM | POA: Diagnosis not present

## 2024-08-06 DIAGNOSIS — H2512 Age-related nuclear cataract, left eye: Secondary | ICD-10-CM

## 2024-08-06 DIAGNOSIS — H353221 Exudative age-related macular degeneration, left eye, with active choroidal neovascularization: Secondary | ICD-10-CM | POA: Diagnosis not present

## 2024-08-06 DIAGNOSIS — H43813 Vitreous degeneration, bilateral: Secondary | ICD-10-CM

## 2024-08-07 DIAGNOSIS — R7301 Impaired fasting glucose: Secondary | ICD-10-CM | POA: Diagnosis not present

## 2024-08-07 DIAGNOSIS — E785 Hyperlipidemia, unspecified: Secondary | ICD-10-CM | POA: Diagnosis not present

## 2024-08-07 DIAGNOSIS — I10 Essential (primary) hypertension: Secondary | ICD-10-CM | POA: Diagnosis not present

## 2024-08-14 DIAGNOSIS — Z23 Encounter for immunization: Secondary | ICD-10-CM | POA: Diagnosis not present

## 2024-08-14 DIAGNOSIS — I739 Peripheral vascular disease, unspecified: Secondary | ICD-10-CM | POA: Diagnosis not present

## 2024-08-14 DIAGNOSIS — I10 Essential (primary) hypertension: Secondary | ICD-10-CM | POA: Diagnosis not present

## 2024-08-14 DIAGNOSIS — M1990 Unspecified osteoarthritis, unspecified site: Secondary | ICD-10-CM | POA: Diagnosis not present

## 2024-08-14 DIAGNOSIS — Z1339 Encounter for screening examination for other mental health and behavioral disorders: Secondary | ICD-10-CM | POA: Diagnosis not present

## 2024-08-14 DIAGNOSIS — G4700 Insomnia, unspecified: Secondary | ICD-10-CM | POA: Diagnosis not present

## 2024-08-14 DIAGNOSIS — Z0000 Encounter for general adult medical examination without abnormal findings: Secondary | ICD-10-CM | POA: Diagnosis not present

## 2024-08-14 DIAGNOSIS — R7301 Impaired fasting glucose: Secondary | ICD-10-CM | POA: Diagnosis not present

## 2024-08-14 DIAGNOSIS — Z1331 Encounter for screening for depression: Secondary | ICD-10-CM | POA: Diagnosis not present

## 2024-08-14 DIAGNOSIS — H8109 Meniere's disease, unspecified ear: Secondary | ICD-10-CM | POA: Diagnosis not present

## 2024-09-17 ENCOUNTER — Encounter (INDEPENDENT_AMBULATORY_CARE_PROVIDER_SITE_OTHER): Admitting: Ophthalmology

## 2024-09-17 DIAGNOSIS — H35033 Hypertensive retinopathy, bilateral: Secondary | ICD-10-CM | POA: Diagnosis not present

## 2024-09-17 DIAGNOSIS — H353221 Exudative age-related macular degeneration, left eye, with active choroidal neovascularization: Secondary | ICD-10-CM | POA: Diagnosis not present

## 2024-09-17 DIAGNOSIS — H43813 Vitreous degeneration, bilateral: Secondary | ICD-10-CM

## 2024-09-17 DIAGNOSIS — I10 Essential (primary) hypertension: Secondary | ICD-10-CM | POA: Diagnosis not present

## 2024-09-17 DIAGNOSIS — H353112 Nonexudative age-related macular degeneration, right eye, intermediate dry stage: Secondary | ICD-10-CM

## 2024-10-26 ENCOUNTER — Encounter (INDEPENDENT_AMBULATORY_CARE_PROVIDER_SITE_OTHER): Admitting: Ophthalmology

## 2024-10-26 DIAGNOSIS — H35033 Hypertensive retinopathy, bilateral: Secondary | ICD-10-CM | POA: Diagnosis not present

## 2024-10-26 DIAGNOSIS — H43813 Vitreous degeneration, bilateral: Secondary | ICD-10-CM | POA: Diagnosis not present

## 2024-10-26 DIAGNOSIS — H353112 Nonexudative age-related macular degeneration, right eye, intermediate dry stage: Secondary | ICD-10-CM | POA: Diagnosis not present

## 2024-10-26 DIAGNOSIS — I10 Essential (primary) hypertension: Secondary | ICD-10-CM

## 2024-10-26 DIAGNOSIS — H353221 Exudative age-related macular degeneration, left eye, with active choroidal neovascularization: Secondary | ICD-10-CM | POA: Diagnosis not present

## 2024-12-03 ENCOUNTER — Encounter (INDEPENDENT_AMBULATORY_CARE_PROVIDER_SITE_OTHER): Admitting: Ophthalmology
